# Patient Record
Sex: Female | Born: 1980 | Hispanic: No | Marital: Married | Smoking: Never smoker
Health system: Southern US, Community
[De-identification: ages and names within clinical notes are randomized; demographics above are authoritative.]

## PROBLEM LIST (undated history)

## (undated) DIAGNOSIS — D569 Thalassemia, unspecified: Secondary | ICD-10-CM

## (undated) DIAGNOSIS — K209 Esophagitis, unspecified without bleeding: Secondary | ICD-10-CM

## (undated) DIAGNOSIS — K2970 Gastritis, unspecified, without bleeding: Secondary | ICD-10-CM

## (undated) HISTORY — DX: Gastritis, unspecified, without bleeding: K29.70

## (undated) HISTORY — PX: WISDOM TOOTH EXTRACTION: SHX21

## (undated) HISTORY — DX: Esophagitis, unspecified without bleeding: K20.90

## (undated) HISTORY — DX: Esophagitis, unspecified: K20.9

## (undated) HISTORY — DX: Thalassemia, unspecified: D56.9

---

## 2005-08-06 ENCOUNTER — Emergency Department: Payer: Self-pay | Admitting: General Practice

## 2012-04-05 ENCOUNTER — Emergency Department: Payer: Self-pay | Admitting: Emergency Medicine

## 2012-11-04 ENCOUNTER — Encounter: Payer: Self-pay | Admitting: Obstetrics and Gynecology

## 2014-04-17 LAB — OB RESULTS CONSOLE ANTIBODY SCREEN: Antibody Screen: NEGATIVE

## 2014-04-17 LAB — OB RESULTS CONSOLE GC/CHLAMYDIA
CHLAMYDIA, DNA PROBE: NEGATIVE
Gonorrhea: NEGATIVE

## 2014-04-17 LAB — OB RESULTS CONSOLE ABO/RH: RH TYPE: POSITIVE

## 2014-04-17 LAB — OB RESULTS CONSOLE RUBELLA ANTIBODY, IGM: RUBELLA: IMMUNE

## 2014-04-17 LAB — OB RESULTS CONSOLE HEPATITIS B SURFACE ANTIGEN: Hepatitis B Surface Ag: NEGATIVE

## 2014-04-17 LAB — OB RESULTS CONSOLE VARICELLA ZOSTER ANTIBODY, IGG: VARICELLA IGG: IMMUNE

## 2014-09-05 LAB — OB RESULTS CONSOLE HIV ANTIBODY (ROUTINE TESTING): HIV: NONREACTIVE

## 2014-09-05 LAB — OB RESULTS CONSOLE RPR: RPR: NONREACTIVE

## 2014-11-06 LAB — OB RESULTS CONSOLE GBS: GBS: NEGATIVE

## 2014-12-07 ENCOUNTER — Inpatient Hospital Stay
Admission: EM | Admit: 2014-12-07 | Discharge: 2014-12-11 | DRG: 765 | Disposition: A | Discharge status: Home / Self Care | Payer: Medicaid Other | Attending: Obstetrics & Gynecology | Admitting: Obstetrics & Gynecology

## 2014-12-07 ENCOUNTER — Encounter: Payer: Self-pay | Admitting: Obstetrics & Gynecology

## 2014-12-07 DIAGNOSIS — E669 Obesity, unspecified: Secondary | ICD-10-CM | POA: Diagnosis present

## 2014-12-07 DIAGNOSIS — D560 Alpha thalassemia: Secondary | ICD-10-CM | POA: Diagnosis present

## 2014-12-07 DIAGNOSIS — O480 Post-term pregnancy: Principal | ICD-10-CM | POA: Diagnosis present

## 2014-12-07 DIAGNOSIS — O09523 Supervision of elderly multigravida, third trimester: Secondary | ICD-10-CM

## 2014-12-07 DIAGNOSIS — O9902 Anemia complicating childbirth: Secondary | ICD-10-CM | POA: Diagnosis present

## 2014-12-07 DIAGNOSIS — Z6830 Body mass index (BMI) 30.0-30.9, adult: Secondary | ICD-10-CM | POA: Diagnosis not present

## 2014-12-07 DIAGNOSIS — D573 Sickle-cell trait: Secondary | ICD-10-CM | POA: Diagnosis present

## 2014-12-07 DIAGNOSIS — O411230 Chorioamnionitis, third trimester, not applicable or unspecified: Secondary | ICD-10-CM | POA: Diagnosis not present

## 2014-12-07 DIAGNOSIS — Z3A41 41 weeks gestation of pregnancy: Secondary | ICD-10-CM | POA: Diagnosis present

## 2014-12-07 DIAGNOSIS — O99214 Obesity complicating childbirth: Secondary | ICD-10-CM | POA: Diagnosis present

## 2014-12-07 LAB — CBC
HCT: 37.2 % (ref 35.0–47.0)
Hemoglobin: 12 g/dL (ref 12.0–16.0)
MCH: 22.7 pg — AB (ref 26.0–34.0)
MCHC: 32.4 g/dL (ref 32.0–36.0)
MCV: 70.1 fL — ABNORMAL LOW (ref 80.0–100.0)
Platelets: 223 10*3/uL (ref 150–440)
RBC: 5.31 MIL/uL — ABNORMAL HIGH (ref 3.80–5.20)
RDW: 14.9 % — AB (ref 11.5–14.5)
WBC: 11.6 10*3/uL — ABNORMAL HIGH (ref 3.6–11.0)

## 2014-12-07 LAB — TYPE AND SCREEN
ABO/RH(D): B POS
ANTIBODY SCREEN: NEGATIVE

## 2014-12-07 MED ORDER — OXYTOCIN 40 UNITS IN LACTATED RINGERS INFUSION - SIMPLE MED
INTRAVENOUS | Status: AC
  Administered 2014-12-07: 2 m[IU]/min via INTRAVENOUS
  Filled 2014-12-07: qty 1000

## 2014-12-07 MED ORDER — OXYTOCIN 40 UNITS IN LACTATED RINGERS INFUSION - SIMPLE MED
62.5000 mL/h | INTRAVENOUS | Status: DC
  Administered 2014-12-08: 100 mL via INTRAVENOUS

## 2014-12-07 MED ORDER — TERBUTALINE SULFATE 1 MG/ML IJ SOLN
0.2500 mg | Freq: Once | INTRAMUSCULAR | Status: DC | PRN
Start: 2014-12-07 — End: 2014-12-08

## 2014-12-07 MED ORDER — MISOPROSTOL 200 MCG PO TABS
ORAL_TABLET | ORAL | Status: AC
  Filled 2014-12-07: qty 4

## 2014-12-07 MED ORDER — AMMONIA AROMATIC IN INHA
RESPIRATORY_TRACT | Status: AC
  Filled 2014-12-07: qty 10

## 2014-12-07 MED ORDER — OXYTOCIN BOLUS FROM INFUSION: 500.0000 mL | INTRAVENOUS | Status: DC

## 2014-12-07 MED ORDER — LACTATED RINGERS IV SOLN
INTRAVENOUS | Status: DC
  Administered 2014-12-07 – 2014-12-08 (×2): via INTRAVENOUS
  Administered 2014-12-08: 250 mL via INTRAVENOUS

## 2014-12-07 MED ORDER — ONDANSETRON HCL 4 MG/2ML IJ SOLN
4.0000 mg | Freq: Four times a day (QID) | INTRAMUSCULAR | Status: DC | PRN
  Administered 2014-12-08: 4 mg via INTRAVENOUS

## 2014-12-07 MED ORDER — LIDOCAINE HCL (PF) 1 % IJ SOLN
30.0000 mL | INTRAMUSCULAR | Status: DC | PRN
  Filled 2014-12-07: qty 30

## 2014-12-07 MED ORDER — ZOLPIDEM TARTRATE 5 MG PO TABS: 5.0000 mg | ORAL_TABLET | Freq: Every evening | ORAL | Status: DC | PRN

## 2014-12-07 MED ORDER — CITRIC ACID-SODIUM CITRATE 334-500 MG/5ML PO SOLN
30.0000 mL | ORAL | Status: DC | PRN
  Administered 2014-12-08: 30 mL via ORAL

## 2014-12-07 MED ORDER — OXYTOCIN 40 UNITS IN LACTATED RINGERS INFUSION - SIMPLE MED
1.0000 m[IU]/min | INTRAVENOUS | Status: DC
  Administered 2014-12-07: 2 m[IU]/min via INTRAVENOUS
  Filled 2014-12-07: qty 1000

## 2014-12-07 MED ORDER — OXYTOCIN 10 UNIT/ML IJ SOLN
INTRAMUSCULAR | Status: AC
  Filled 2014-12-07: qty 2

## 2014-12-07 MED ORDER — LIDOCAINE HCL (PF) 1 % IJ SOLN
INTRAMUSCULAR | Status: AC
  Filled 2014-12-07: qty 30

## 2014-12-07 MED ORDER — LACTATED RINGERS IV SOLN
500.0000 mL | INTRAVENOUS | Status: DC | PRN
Start: 2014-12-07 — End: 2014-12-08

## 2014-12-07 NOTE — Plan of Care (Signed)
Problem: Consults Goal: Birthing Suites Patient Information Press F2 to bring up selections list  Pt > [redacted] weeks EGA and Inpatient induction        

## 2014-12-07 NOTE — Progress Notes (Signed)
Pt arrived to [facility] for IOL per Dr. Elesa Massed order.  [facility] 11/30/2014. [id number].  Ellison Carwin RNC

## 2014-12-07 NOTE — Plan of Care (Signed)
Problem: Consults Goal: Birthing Suites Patient Information Press F2 to bring up selections list Outcome: Completed/Met Date Met:  12/07/14  Pt > [redacted] weeks EGA and Inpatient induction

## 2014-12-07 NOTE — H&P (Signed)
OB History & Physical   History of Present Illness:  Chief Complaint:   HPI:  Emily Bailey is a 34 y.o. G2P0010 at 94.0 dated by LMP with early 1st trimester [place] and EDC of 11/30/14. She presents to L&D for evaluation of IOL for past due dates.    Pregnancy issues: Hgb E trait  Alpha thalassemia FOB with sickle cell trait AMA Obesity (BMI 30)   +FM, no CTX, no LOF, no VB  Maternal Medical History:  No past medical history on file.  No past surgical history on file.  Allergies not on file  Prior to Admission medications   Not on File     Prenatal care site: [facility]   Social History: neg     Family History: family history is not on file.   Review of Systems: Negative x 10 systems reviewed except as noted in the HPI.     Physical Exam:  Vital Signs: Temp(Src) 97.8 F (36.6 C) (Oral)  Resp 18  Ht  (1.473 m)  Wt 78.472 kg (173 lb)  BMI 36.17 kg/m2 General: no acute distress.  HEENT: normocephalic, atraumatic Heart: regular rate & rhythm.  No murmurs/rubs/gallops Lungs: clear to auscultation bilaterally, normal respiratory effort Abdomen: soft, gravid, non-tender;  EFW: 8.2oz Pelvic:   External: Normal external female genitalia  Cervix: Dilation: 4/80/-1   Extremities: non-tender, symmetric, 1+ edema bilaterally.  DTRs: 2+  Neurologic: Alert & oriented x 3.    Pertinent Results:  Prenatal Labs: Blood type/Rh B+  Antibody screen neg  Rubella Varicella Immune Immune  RPR NR  HBsAg neg  HIV NR  GC neg  Chlamydia neg  Genetic screening declined  1 hour GTT 132  3 hour GTT n/a  GBS neg   FHT: 150 mod + accesl no decels TOCO: q104min SVE: 4/80/-1    Assessment:  Emily Bailey is a 34 y.o. G2P0010 @ 41.0 with IOL for past due date.   Plan:  1. Admit to Labor & Delivery  2. CBC, T&S, Clrs, IVF 3. GBS neg 4. Consents obtained. 5. Continuous toco/efm 6. AROM when nursing available, augment with pitocin if labor does not progress  after AROM 7. Expect vaginal delivery  Emily Bailey, Elenora Fender  12/07/2014 3:13 PM

## 2014-12-07 NOTE — H&P (Signed)
Intrapartum progress note:  S patient comfortable no complaints,feels some back pain/pelvic pressure O: BP 119/73 mmHg  Pulse 91  Temp(Src) 97.8 F (36.6 C) (Oral)  Resp 18  SVE: 4/80/-1 FHT: 140 mod +accels no decels Toco: q  A/P: 34yo G2P0010 @ 41.0 with IOL for past due date 1. Continue IOL - add pitocin for contractions 2. Category 1 strip 3. Continue efm/toco  ----- Ranae Plumber, MD Attending Obstetrician and Gynecologist [facility]

## 2014-12-08 ENCOUNTER — Inpatient Hospital Stay: Payer: Medicaid Other | Admitting: Anesthesiology

## 2014-12-08 ENCOUNTER — Encounter
Admission: EM | Disposition: A | Discharge status: Home / Self Care | Payer: Self-pay | Source: Home / Self Care | Attending: Obstetrics & Gynecology

## 2014-12-08 ENCOUNTER — Encounter: Payer: Self-pay | Admitting: *Deleted

## 2014-12-08 LAB — ABO/RH: ABO/RH(D): B POS

## 2014-12-08 LAB — RPR: RPR Ser Ql: NONREACTIVE

## 2014-12-08 SURGERY — Surgical Case
Anesthesia: Epidural | Wound class: Clean Contaminated

## 2014-12-08 MED ORDER — PHENYLEPHRINE 40 MCG/ML (10ML) SYRINGE FOR IV PUSH (FOR BLOOD PRESSURE SUPPORT)
80.0000 ug | PREFILLED_SYRINGE | INTRAVENOUS | Status: DC | PRN
Start: 2014-12-08 — End: 2014-12-08
  Filled 2014-12-08: qty 2

## 2014-12-08 MED ORDER — BUPIVACAINE HCL 0.5 % IJ SOLN
INTRAMUSCULAR | Status: DC | PRN
  Administered 2014-12-08: 10 mL

## 2014-12-08 MED ORDER — EPINEPHRINE HCL 0.1 MG/ML IJ SOSY
PREFILLED_SYRINGE | INTRAMUSCULAR | Status: DC | PRN
  Administered 2014-12-08: 0.1 mg via INTRAVENOUS

## 2014-12-08 MED ORDER — PRENATAL MULTIVITAMIN CH
1.0000 | ORAL_TABLET | Freq: Every day | ORAL | Status: DC
  Administered 2014-12-09 – 2014-12-10 (×2): 1 via ORAL
  Filled 2014-12-08 (×2): qty 1

## 2014-12-08 MED ORDER — BUPIVACAINE ON-Q PAIN PUMP (FOR ORDER SET NO CHG): INJECTION | Status: DC

## 2014-12-08 MED ORDER — BUPIVACAINE 0.25 % ON-Q PUMP DUAL CATH 400 ML: 400.0000 mL | INJECTION | Status: DC

## 2014-12-08 MED ORDER — DIPHENHYDRAMINE HCL 25 MG PO CAPS: 25.0000 mg | ORAL_CAPSULE | Freq: Four times a day (QID) | ORAL | Status: DC | PRN

## 2014-12-08 MED ORDER — BUTORPHANOL TARTRATE 1 MG/ML IJ SOLN
2.0000 mg | INTRAMUSCULAR | Status: DC | PRN
  Administered 2014-12-08 (×2): 2 mg via INTRAVENOUS

## 2014-12-08 MED ORDER — SODIUM CHLORIDE 0.9 % IV SOLN
2.0000 g | Freq: Four times a day (QID) | INTRAVENOUS | Status: AC
  Administered 2014-12-08 – 2014-12-09 (×3): 2 g via INTRAVENOUS
  Filled 2014-12-08 (×5): qty 2000

## 2014-12-08 MED ORDER — LACTATED RINGERS IV SOLN: INTRAVENOUS | Status: DC

## 2014-12-08 MED ORDER — CEFAZOLIN SODIUM-DEXTROSE 2-3 GM-% IV SOLR: 2.0000 g | INTRAVENOUS | Status: DC

## 2014-12-08 MED ORDER — FERROUS SULFATE 325 (65 FE) MG PO TABS
325.0000 mg | ORAL_TABLET | Freq: Two times a day (BID) | ORAL | Status: DC
  Administered 2014-12-09 – 2014-12-11 (×4): 325 mg via ORAL
  Filled 2014-12-08 (×4): qty 1

## 2014-12-08 MED ORDER — ONDANSETRON HCL 4 MG/2ML IJ SOLN: 4.0000 mg | Freq: Once | INTRAMUSCULAR | Status: DC | PRN

## 2014-12-08 MED ORDER — CITRIC ACID-SODIUM CITRATE 334-500 MG/5ML PO SOLN: 30.0000 mL | ORAL | Status: DC

## 2014-12-08 MED ORDER — FENTANYL 2.5 MCG/ML W/ROPIVACAINE 0.2% IN NS 100 ML EPIDURAL INFUSION (ARMC-ANES): 10.0000 mL/h | EPIDURAL | Status: DC

## 2014-12-08 MED ORDER — PHENYLEPHRINE HCL 10 MG/ML IJ SOLN
INTRAMUSCULAR | Status: DC | PRN
  Administered 2014-12-08 (×7): 100 ug via INTRAVENOUS

## 2014-12-08 MED ORDER — LIDOCAINE HCL (PF) 2 % IJ SOLN
INTRAMUSCULAR | Status: DC | PRN
  Administered 2014-12-08 (×2): 100 mg via EPIDURAL

## 2014-12-08 MED ORDER — SODIUM CHLORIDE 0.9 % IV SOLN
INTRAVENOUS | Status: AC
  Administered 2014-12-08: 2 g via INTRAVENOUS
  Filled 2014-12-08: qty 2000

## 2014-12-08 MED ORDER — BUPIVACAINE HCL 0.5 % IJ SOLN: 5.0000 mL | Freq: Once | INTRAMUSCULAR | Status: DC

## 2014-12-08 MED ORDER — BUTORPHANOL TARTRATE 1 MG/ML IJ SOLN
INTRAMUSCULAR | Status: AC
  Administered 2014-12-08: 2 mg via INTRAVENOUS
  Filled 2014-12-08: qty 2

## 2014-12-08 MED ORDER — CITRIC ACID-SODIUM CITRATE 334-500 MG/5ML PO SOLN
ORAL | Status: AC
  Administered 2014-12-08: 30 mL via ORAL
  Filled 2014-12-08: qty 15

## 2014-12-08 MED ORDER — GENTAMICIN SULFATE 40 MG/ML IJ SOLN
100.0000 mg | Freq: Three times a day (TID) | INTRAMUSCULAR | Status: DC
  Filled 2014-12-08 (×3): qty 2.5

## 2014-12-08 MED ORDER — DIBUCAINE 1 % RE OINT: 1.0000 "application " | TOPICAL_OINTMENT | RECTAL | Status: DC | PRN

## 2014-12-08 MED ORDER — MENTHOL 3 MG MT LOZG: 1.0000 | LOZENGE | OROMUCOSAL | Status: DC | PRN

## 2014-12-08 MED ORDER — EPHEDRINE 5 MG/ML INJ
10.0000 mg | INTRAVENOUS | Status: DC | PRN
  Filled 2014-12-08: qty 2

## 2014-12-08 MED ORDER — CEFAZOLIN SODIUM-DEXTROSE 2-3 GM-% IV SOLR
INTRAVENOUS | Status: AC
  Filled 2014-12-08: qty 50

## 2014-12-08 MED ORDER — ACETAMINOPHEN 10 MG/ML IV SOLN
1000.0000 mg | Freq: Once | INTRAVENOUS | Status: AC
  Administered 2014-12-08: 1000 mg via INTRAVENOUS
  Filled 2014-12-08 (×2): qty 100

## 2014-12-08 MED ORDER — KETOROLAC TROMETHAMINE 30 MG/ML IJ SOLN
30.0000 mg | Freq: Four times a day (QID) | INTRAMUSCULAR | Status: AC
  Administered 2014-12-08 – 2014-12-09 (×2): 30 mg via INTRAVENOUS
  Filled 2014-12-08 (×2): qty 1

## 2014-12-08 MED ORDER — MORPHINE SULFATE (PF) 0.5 MG/ML IJ SOLN
INTRAMUSCULAR | Status: DC | PRN
  Administered 2014-12-08: .5 mg via EPIDURAL

## 2014-12-08 MED ORDER — HYDROCODONE-ACETAMINOPHEN 5-325 MG PO TABS
1.0000 | ORAL_TABLET | ORAL | Status: DC | PRN
  Administered 2014-12-09 – 2014-12-10 (×6): 2 via ORAL
  Administered 2014-12-10 – 2014-12-11 (×3): 1 via ORAL
  Administered 2014-12-11: 2 via ORAL
  Filled 2014-12-08 (×4): qty 2
  Filled 2014-12-08: qty 1
  Filled 2014-12-08 (×4): qty 2
  Filled 2014-12-08: qty 1

## 2014-12-08 MED ORDER — METHYLERGONOVINE MALEATE 0.2 MG/ML IJ SOLN
INTRAMUSCULAR | Status: AC
  Filled 2014-12-08: qty 1

## 2014-12-08 MED ORDER — SIMETHICONE 80 MG PO CHEW
80.0000 mg | CHEWABLE_TABLET | Freq: Three times a day (TID) | ORAL | Status: DC
  Administered 2014-12-09 – 2014-12-11 (×6): 80 mg via ORAL
  Filled 2014-12-08 (×6): qty 1

## 2014-12-08 MED ORDER — GENTAMICIN SULFATE 40 MG/ML IJ SOLN
120.0000 mg | Freq: Once | INTRAVENOUS | Status: AC
  Administered 2014-12-08: 120 mg via INTRAVENOUS
  Filled 2014-12-08: qty 3

## 2014-12-08 MED ORDER — BUPIVACAINE HCL (PF) 0.5 % IJ SOLN
INTRAMUSCULAR | Status: AC
  Filled 2014-12-08: qty 30

## 2014-12-08 MED ORDER — DIPHENHYDRAMINE HCL 50 MG/ML IJ SOLN: 12.5000 mg | INTRAMUSCULAR | Status: DC | PRN

## 2014-12-08 MED ORDER — SENNOSIDES-DOCUSATE SODIUM 8.6-50 MG PO TABS
2.0000 | ORAL_TABLET | ORAL | Status: DC
  Administered 2014-12-09 – 2014-12-10 (×4): 2 via ORAL
  Filled 2014-12-08 (×4): qty 2

## 2014-12-08 MED ORDER — WITCH HAZEL-GLYCERIN EX PADS: 1.0000 "application " | MEDICATED_PAD | CUTANEOUS | Status: DC | PRN

## 2014-12-08 MED ORDER — GENTAMICIN IN SALINE 1-0.9 MG/ML-% IV SOLN
100.0000 mg | Freq: Three times a day (TID) | INTRAVENOUS | Status: DC
  Filled 2014-12-08 (×2): qty 100

## 2014-12-08 MED ORDER — CLINDAMYCIN PHOSPHATE 900 MG/50ML IV SOLN
900.0000 mg | Freq: Three times a day (TID) | INTRAVENOUS | Status: AC
  Administered 2014-12-08 – 2014-12-09 (×3): 900 mg via INTRAVENOUS
  Filled 2014-12-08 (×3): qty 50

## 2014-12-08 MED ORDER — SODIUM CHLORIDE 0.9 % IV SOLN
2.0000 g | Freq: Four times a day (QID) | INTRAVENOUS | Status: DC
  Administered 2014-12-08: 2 g via INTRAVENOUS
  Filled 2014-12-08 (×3): qty 2000

## 2014-12-08 MED ORDER — FENTANYL 2.5 MCG/ML W/ROPIVACAINE 0.2% IN NS 100 ML EPIDURAL INFUSION (ARMC-ANES)
EPIDURAL | Status: AC
  Administered 2014-12-08: 10 mL/h via EPIDURAL
  Filled 2014-12-08: qty 100

## 2014-12-08 MED ORDER — LANOLIN HYDROUS EX OINT: 1.0000 "application " | TOPICAL_OINTMENT | CUTANEOUS | Status: DC | PRN

## 2014-12-08 MED ORDER — OXYTOCIN 40 UNITS IN LACTATED RINGERS INFUSION - SIMPLE MED
62.5000 mL/h | INTRAVENOUS | Status: AC
  Administered 2014-12-09: 62.5 mL/h via INTRAVENOUS
  Filled 2014-12-08: qty 1000

## 2014-12-08 MED ORDER — FENTANYL CITRATE (PF) 100 MCG/2ML IJ SOLN: 25.0000 ug | INTRAMUSCULAR | Status: DC | PRN

## 2014-12-08 SURGICAL SUPPLY — 27 items
CANISTER SUCT 3000ML (MISCELLANEOUS) ×3 IMPLANT
CATH KIT ON-Q SILVERSOAK 5IN (CATHETERS) ×6 IMPLANT
CLOSURE WOUND 1/2 X4 (GAUZE/BANDAGES/DRESSINGS) ×2
DRSG TELFA 3X8 NADH (GAUZE/BANDAGES/DRESSINGS) ×3 IMPLANT
ELECT CAUTERY BLADE 6.4 (BLADE) ×3 IMPLANT
GAUZE SPONGE 4X4 12PLY STRL (GAUZE/BANDAGES/DRESSINGS) ×3 IMPLANT
GLOVE BIO SURGEON STRL SZ7 (GLOVE) ×9 IMPLANT
GLOVE INDICATOR 7.5 STRL GRN (GLOVE) ×9 IMPLANT
GOWN STRL REUS W/ TWL LRG LVL3 (GOWN DISPOSABLE) ×3 IMPLANT
GOWN STRL REUS W/TWL LRG LVL3 (GOWN DISPOSABLE) ×6
LIQUID BAND (GAUZE/BANDAGES/DRESSINGS) IMPLANT
NS IRRIG 1000ML POUR BTL (IV SOLUTION) ×3 IMPLANT
PACK C SECTION AR (MISCELLANEOUS) ×3 IMPLANT
PAD GROUND ADULT SPLIT (MISCELLANEOUS) ×3 IMPLANT
PAD OB MATERNITY 4.3X12.25 (PERSONAL CARE ITEMS) ×6 IMPLANT
PAD PREP 24X41 OB/GYN DISP (PERSONAL CARE ITEMS) ×3 IMPLANT
SPONGE LAP 18X18 5 PK (GAUZE/BANDAGES/DRESSINGS) ×3 IMPLANT
STRIP CLOSURE SKIN 1/2X4 (GAUZE/BANDAGES/DRESSINGS) ×4 IMPLANT
SUT CHROMIC GUT BROWN 0 54 (SUTURE) ×1 IMPLANT
SUT CHROMIC GUT BROWN 0 54IN (SUTURE) ×3
SUT MNCRL 4-0 (SUTURE) ×2
SUT MNCRL 4-0 27XMFL (SUTURE) ×1
SUT PDS AB 1 TP1 96 (SUTURE) ×3 IMPLANT
SUT PLAIN 2 0 XLH (SUTURE) ×3 IMPLANT
SUT VIC AB 0 CT1 36 (SUTURE) ×12 IMPLANT
SUTURE MNCRL 4-0 27XMF (SUTURE) ×1 IMPLANT
SWABSTK COMLB BENZOIN TINCTURE (MISCELLANEOUS) ×3 IMPLANT

## 2014-12-08 NOTE — Anesthesia Preprocedure Evaluation (Signed)
Anesthesia Evaluation  Patient identified by MRN, date of birth, ID band Patient awake    Reviewed: Allergy & Precautions, NPO status , Patient's Chart, lab work & pertinent test results  History of Anesthesia Complications (+) history of anesthetic complications  Airway Mallampati: II  TM Distance: <3 FB Neck ROM: Full    Dental  (+) Teeth Intact   Pulmonary          Cardiovascular negative cardio ROS      Neuro/Psych negative neurological ROS     GI/Hepatic negative GI ROS, Neg liver ROS,   Endo/Other    Renal/GU negative Renal ROS     Musculoskeletal   Abdominal   Peds  Hematology negative hematology ROS (+)   Anesthesia Other Findings   Reproductive/Obstetrics (+) Pregnancy                             Anesthesia Physical Anesthesia Plan  ASA: II  Anesthesia Plan: Epidural   Post-op Pain Management:    Induction:   Airway Management Planned:   Additional Equipment:   Intra-op Plan:   Post-operative Plan:   Informed Consent: I have reviewed the patients History and Physical, chart, labs and discussed the procedure including the risks, benefits and alternatives for the proposed anesthesia with the patient or authorized representative who has indicated his/her understanding and acceptance.     Plan Discussed with:   Anesthesia Plan Comments:         Anesthesia Quick Evaluation

## 2014-12-08 NOTE — Anesthesia Preprocedure Evaluation (Addendum)
Anesthesia Evaluation  Patient identified by MRN, date of birth, ID band Patient awake    Reviewed: Allergy & Precautions, NPO status , Patient's Chart, lab work & pertinent test results, reviewed documented beta blocker date and time   Airway Mallampati: III  TM Distance: >3 FB     Dental  (+) Chipped   Pulmonary          Cardiovascular     Neuro/Psych    GI/Hepatic   Endo/Other    Renal/GU      Musculoskeletal   Abdominal   Peds  Hematology   Anesthesia Other Findings Epidural working well. Will dose 1.5% lidocaine and epi and astromorph thru catheter.  Reproductive/Obstetrics                            Anesthesia Physical Anesthesia Plan  ASA: II  Anesthesia Plan: Epidural   Post-op Pain Management:    Induction:   Airway Management Planned:   Additional Equipment:   Intra-op Plan:   Post-operative Plan:   Informed Consent: I have reviewed the patients History and Physical, chart, labs and discussed the procedure including the risks, benefits and alternatives for the proposed anesthesia with the patient or authorized representative who has indicated his/her understanding and acceptance.     Plan Discussed with: CRNA  Anesthesia Plan Comments:         Anesthesia Quick Evaluation

## 2014-12-08 NOTE — Progress Notes (Addendum)
Patient now febrile with temp >102, P>110, fetal tachycardia. This meets diagnostic criteria for chorioamnionitis. Will start ampicillin 2 grams q 6 hours and gentamicin  initial dose, then  q 8hours. Will watch fetal status closely and continue to expedite delivery FWB: category 2 overall.  Will try to give Tylenol to mom and hope for defervescence to improve fetal status.

## 2014-12-08 NOTE — Progress Notes (Signed)
Patient still febrile after tylenol, ampicillin, gentimicin.  Pitocin has been off for over one hour and she is still having mild spontaneous contractions, with each the fetus is having late decelerations.  She has had at least two IVF boluses. Discussed need to expedite delivery. However, I am unable to start pitocin on her without her have recurrent late decelerations. Therefore, I recommend a primary cesarean delivery due to fetal intolerance of labor, likely due to a combination of factors. However, given that there were reported late decelerations prior to developing chorio, the primary reason is likely uteroplacental insufficiency.  The risks and benefits of proceeding with attempted vaginal delivery versus moving forward with cesarean delivery (and its attendant risks and benefits) were explained in detail.  She voiced understanding and desire to move forward with cesarean delivery.

## 2014-12-08 NOTE — Anesthesia Procedure Notes (Signed)
Epidural Patient location during procedure: OB Start time: 12/08/2014 5:00 AM End time: 12/08/2014 5:23 AM  Staffing Performed by: anesthesiologist   Preanesthetic Checklist Completed: patient identified, site marked, surgical consent, pre-op evaluation, timeout performed, IV checked, risks and benefits discussed and monitors and equipment checked  Epidural Patient position: sitting Prep: Betadine Patient monitoring: heart rate, continuous pulse ox and blood pressure Approach: midline Location: L4-L5 Injection technique: LOR saline  Needle:  Needle type: Tuohy  Needle gauge: 18 G Needle length: 9 cm and 9 Needle insertion depth: 7 cm Catheter type: closed end flexible Catheter size: 20 Guage Catheter at skin depth: 8 cm Test dose: negative and 1.5% lidocaine with Epi 1:200 K  Assessment Sensory level: T10 Events: blood not aspirated, injection not painful, no injection resistance, negative IV test and no paresthesia  Additional Notes   Patient tolerated the insertion well without complications.Reason for block:procedure for pain

## 2014-12-08 NOTE — Progress Notes (Signed)
L&D Note  12/08/2014 - 9:16 AM  Subjective:  Comfortable with epidural  Objective:   Filed Vitals:   12/08/14 0629 12/08/14 0634 12/08/14 0639 12/08/14 0805  BP:    104/55  Pulse:    98  Temp:      TempSrc:      Resp:      Height:      Weight:      SpO2: 100% 100% 99%     Current Vital Signs 24h Vital Sign Ranges  T 98.4 F (36.9 C) Temp  Avg: 98.2 F (36.8 C)  Min: 97.8 F (36.6 C)  Max: 98.5 F (36.9 C)  BP (!) 104/55 mmHg BP  Min: 88/52  Max: 122/76  HR 98 Pulse  Avg: 99.1  Min: 86  Max: 116  RR 20 Resp  Avg: 18.4  Min: 18  Max: 20  SaO2 99 %   SpO2  Avg: 99.4 %  Min: 98 %  Max: 100 %       24 Hour I/O Current Shift I/O  Time Ins Outs        FHR: 145/mod var/+accels/periodic variable decelerations, report of infrequent late decelerations =>improved after iv fluid bolus Toco: 3 q 10 min Gen: NAD, comfortable SVE: 5/80/-1 (Previously reported as 9-9.5cm per RN on prior shift)  Labs:   Recent Labs Lab 12/07/14 1804  WBC 11.6*  HGB 12.0  HCT 37.2  PLT 223   Medications SCHEDULED MEDICATIONS   None  MEDICATION INFUSIONS  . lactated ringers 250 mL (12/08/14 0720)  . oxytocin 40 units in LR 1000 mL    . oxytocin 40 units in LR 1000 mL 10 milli-units/min (12/08/14 0817)  . oxytocin 40 units in LR 1000 mL      PRN MEDICATIONS  butorphanol, citric acid-sodium citrate, lactated ringers, lidocaine (PF), ondansetron, terbutaline, zolpidem   Assessment & Plan:  34 y.o. G2P0010 at [redacted]w[redacted]d IOL  * Labor: Continue pitocin per protocol.  Will place IUPC if no change once reaches 20. * FWB: Reassuring overall. Will monitor.  ROM > 18 hours now.   *GBS: neg *Analgesia: epidural  Conard Novak, MD 12/08/2014 9:16 AM  [facility]

## 2014-12-08 NOTE — Transfer of Care (Signed)
Immediate Anesthesia Transfer of Care Note  Patient: Emily Bailey  Procedure(s) Performed: Procedure(s): PRIMARY CESAREAN SECTION (N/A)  Patient Location: PACU and Mother/Baby  Anesthesia Type:Epidural  Level of Consciousness: awake, alert  and oriented  Airway & Oxygen Therapy: Patient Spontanous Breathing  Post-op Assessment: Report given to RN  Post vital signs: Reviewed and stable  Last Vitals:  Filed Vitals:   12/08/14 1618  BP: 117/59  Pulse: 103  Temp: 97.51F  Resp: 20    Complications: No apparent anesthesia complications

## 2014-12-08 NOTE — Discharge Summary (Signed)
Obstetrical Discharge Summary  Date of Admission: 12/07/2014 Date of Discharge: 12/11/2014  Primary OB: [facility]  Gestational Age at Delivery: [redacted]w[redacted]d   Antepartum complications: Advanced maternal age, hemoglobin E trait Reason for Admission: induction of labor for dates. Date of Delivery:  12/08/2014  Delivered By: Conard Novak, MD Delivery Type: primary cesarean section, low transverse incision Intrapartum complications/course: Patient developed chorioamnionitis, given IV ampicillin and gentamicin and tylenol.  Prior to being diagnosed with chorioamnionitis, the fetus was having late decelerations and not tolerating contractions.  Due to fetal intolerance of labor, the patient was taken to the OR.   Anesthesia: epidural Placenta: manual Laceration: None Episiotomy: none Newborn Data: Live born female  Birth Weight: 7 lb 13.6 oz (3561 g) APGAR: 8, 9  Discharge Physical Exam:  BP 108/60 mmHg  Pulse 80  Temp(Src) 98 F (36.7 C) (Oral)  Resp 20  Ht  (1.473 m)  Wt 78.472 kg (173 lb)  BMI 36.17 kg/m2  SpO2 98%  Breastfeeding? Unknown  General: NAD CV: RRR Pulm: CTABL, nl effort ABD: s/nd/nt, fundus firm and below the umbilicus Lochia: moderate Incision: c/d/i, covered in steris; on-q pump in place DVT Evaluation: LE non-ttp, no evidence of DVT on exam.  HEMOGLOBIN  Date Value Ref Range Status  12/10/2014 10.0* 12.0 - 16.0 g/dL Final   HCT  Date Value Ref Range Status  12/10/2014 30.1* 35.0 - 47.0 % Final    Post partum course: routine, no events noted Postpartum Procedures: none Disposition: stable, discharge to home.  Rh Immune globulin given: n/a Rubella vaccine given: n/a Tdap vaccine given in AP or PP setting: given 10/23/14 Flu vaccine given in AP or PP setting: no  Contraception: TBD at post partum visit  Prenatal Labs:  B+/RI/VZi   Plan:  Emily Bailey was discharged to home in good condition. Follow-up appointment at [facility]  with Dr Jean Rosenthal in 1week   Discharge Medications:   Medication List    TAKE these medications        HYDROcodone-acetaminophen 5-325 MG per tablet  Commonly known as:  NORCO/VICODIN  Take 1-2 tablets by mouth every 4 (four) hours as needed for moderate pain.     ibuprofen 600 MG tablet  Commonly known as:  ADVIL,MOTRIN  Take 1 tablet (600 mg total) by mouth every 6 (six) hours as needed.     multivitamin-prenatal 27-0.8 MG Tabs tablet  Take 1 tablet by mouth daily at 12 noon.        Signed: ----- Ranae Plumber, MD Attending Obstetrician and Gynecologist [facility]

## 2014-12-08 NOTE — Op Note (Signed)
Emily Bailey   Emily Bailey   12/08/2014   Pre-operative Diagnosis: 1) intrauterine pregnancy at [redacted]w[redacted]d, 2) fetal intolerance of labor, 3) Chorioamnionitis   Post-operative Diagnosis: 1) intrauterine pregnancy at [redacted]w[redacted]d, 2) fetal intolerance of labor, 3) Chorioamnionitis  Procedure: Primary low transverse Emily section via Pfannenstiel incision  Surgeon: Surgeon(s) and Role:    * Conard Novak, MD - Primary   Assistants: Farrel Conners, CNM  Anesthesia: epidural   Findings:  1) normal appearing gravid uterus, fallopian tubes, and ovaries 2) Viable female infant "Emily Bailey" with APGARS of 8 at 1 minute and 9 at 5 minutes   Estimated Blood Loss: 750 mL  Total IV Fluids: 900 ml   Urine Output: 750 mL clear urine at end of case  Specimens: Placenta for permanent  Complications: no complications  Disposition: PACU - hemodynamically stable.   Maternal Condition: stable   Baby condition / location:  Couplet care / Skin to Skin  Procedure Details:  The patient was seen in the Holding Room. The risks, benefits, complications, treatment options, and expected outcomes were discussed with the patient. The patient concurred with the proposed plan, giving informed consent. identified as Emily Bailey and the procedure verified as C-Section Delivery. A Time Out was held and the above information confirmed.   After induction of anesthesia, the patient was draped and prepped in the usual sterile manner. A Pfannenstiel incision was made and carried down through the subcutaneous tissue to the fascia. Fascial incision was made and extended transversely. The fascia was separated from the underlying rectus tissue superiorly and inferiorly. The peritoneum was identified and entered. Peritoneal incision was extended longitudinally. The bladder flap was bluntly freed from the lower uterine segment. A low transverse uterine incision was made and the hysterotomy was  extended with cranial-caudal tension. Delivered from cephalic presentation was a 3,560 gram Living newborn infant(s) with Apgar scores of 8 at one minute and 9 at five minutes. Cord ph was not sent the umbilical cord was clamped and cut cord blood was not obtained for evaluation. The placenta was removed Intact and appeared normal. The uterine outline, tubes and ovaries appeared normal. The uterine incision was closed with running locked sutures of 0 Vicryl.  A second layer of the same suture was thrown in an imbricating fashion.  Hemostasis was assured.  The uterus was retained to the abdomen and the paracolic gutters were cleared of all clots and debris.  The peritoneum was reapproximated with 0 vicryl. The rectus muscles were inspected and found to be hemostatic.   The On-Q catheter pumps were inserted in accordance with the manufacturer's recommendations.  The catheters were inserted approximately 4cm cephelad to the incision line, approximately 1cm apart, straddling the midline.  They were inserted to a depth of the 4th mark. They were positioned superficial to the rectus abdominus muscles and deep to the rectus fascia.    The fascia was then reapproximated with running sutures of 1-0 PDS, looped. The subcutaneous tissue was reapproximated with three interrupted 3-0 Vicryl sutures to reduce skin tension. The subcuticular closure was performed using 4-0 monocryl. The skin closure was reinforced using benzoin and 1/2" steri-strips.  The On-Q catheters were bolused with 5 mL of 0.5% marcaine plain for a total of 10 mL.  The catheters were affixed to the skin with surgical skin glue, steri-strips, and tegaderm.    Instrument, sponge, and needle counts were correct prior the abdominal closure and were correct at the conclusion  of the case.  The patient received Ancef 2 gram IV prior to skin incision (within 30 minutes). For VTE prophylaxis she was wearing SCDs throughout the case.   Signed: Conard Novak, MD, FACOG 12/08/2014 4:05 PM

## 2014-12-09 LAB — CBC
HEMATOCRIT: 34.3 % — AB (ref 35.0–47.0)
HEMOGLOBIN: 11.2 g/dL — AB (ref 12.0–16.0)
MCH: 23 pg — AB (ref 26.0–34.0)
MCHC: 32.5 g/dL (ref 32.0–36.0)
MCV: 70.8 fL — ABNORMAL LOW (ref 80.0–100.0)
Platelets: 206 10*3/uL (ref 150–440)
RBC: 4.84 MIL/uL (ref 3.80–5.20)
RDW: 15.2 % — ABNORMAL HIGH (ref 11.5–14.5)
WBC: 23.5 10*3/uL — ABNORMAL HIGH (ref 3.6–11.0)

## 2014-12-09 MED ORDER — FENTANYL CITRATE (PF) 100 MCG/2ML IJ SOLN: 25.0000 ug | INTRAMUSCULAR | Status: DC | PRN

## 2014-12-09 MED ORDER — ONDANSETRON HCL 4 MG/2ML IJ SOLN: 4.0000 mg | Freq: Once | INTRAMUSCULAR | Status: DC | PRN

## 2014-12-09 NOTE — Progress Notes (Signed)
Admit Date: 12/07/2014 Today's Date: 12/09/2014  Subjective: Postpartum Day 1: Cesarean Delivery Patient reports incisional pain, tolerating PO, + flatus and no problems voiding.    Objective: Vital signs in last 24 hours: Temp:  [97.6 F (36.4 C)-101.9 F (38.8 C)] 98 F (36.7 C) (09/03 0809) Pulse Rate:  [86-117] 101 (09/03 0809) Resp:  [16-18] 18 (09/03 0809) BP: (91-123)/(41-68) 99/51 mmHg (09/03 0809) SpO2:  [97 %-100 %] 97 % (09/03 0809)  Physical Exam:  General: alert and cooperative Lochia: appropriate Uterine Fundus: firm Incision: healing well DVT Evaluation: No evidence of DVT seen on physical exam.   Recent Labs  12/07/14 1804 12/09/14 0512  HGB 12.0 11.2*  HCT 37.2 34.3*    Assessment/Plan: Status post Cesarean section. Doing well postoperatively.  Continue current care.  Francesca Strome PAUL 12/09/2014, 12:34 PM

## 2014-12-09 NOTE — Anesthesia Postprocedure Evaluation (Signed)
  Anesthesia Post-op Note  Patient: Emily Bailey  Procedure(s) Performed: Procedure(s): PRIMARY CESAREAN SECTION (N/A)  Anesthesia type:Epidural  Patient location: PACU  Post pain: Pain level controlled  Post assessment: Post-op Vital signs reviewed, Patient's Cardiovascular Status Stable, Respiratory Function Stable, Patent Airway and No signs of Nausea or vomiting  Post vital signs: Reviewed and stable  Last Vitals:  Filed Vitals:   12/09/14 0809  BP: 99/51  Pulse: 101  Temp: 36.7 C  Resp: 18    Level of consciousness: awake, alert  and patient cooperative  Complications: No apparent anesthesia complications

## 2014-12-09 NOTE — Anesthesia Postprocedure Evaluation (Signed)
  Anesthesia Post-op Note  Patient: Emily Bailey  Procedure(s) Performed: * No procedures listed *  Anesthesia type:Epidural  Patient location: PACU  Post pain: Pain level controlled  Post assessment: Post-op Vital signs reviewed, Patient's Cardiovascular Status Stable, Respiratory Function Stable, Patent Airway and No signs of Nausea or vomiting  Post vital signs: Reviewed and stable  Last Vitals:  Filed Vitals:   12/09/14 0809  BP: 99/51  Pulse: 101  Temp: 36.7 C  Resp: 18    Level of consciousness: awake, alert  and patient cooperative  Complications: No apparent anesthesia complications

## 2014-12-09 NOTE — Anesthesia Post-op Follow-up Note (Signed)
  Anesthesia Pain Follow-up Note  Patient: Emily Bailey  Day #: 1  Date of Follow-up: 12/09/2014 Time: 11:15 AM  Last Vitals:  Filed Vitals:   12/09/14 0809  BP: 99/51  Pulse: 101  Temp: 36.7 C  Resp: 18    Level of Consciousness: alert  Pain: mild   Side Effects:None  Catheter Site Exam:clean, dry, no drainage  Plan: D/C from anesthesia care  Lenard Simmer

## 2014-12-10 LAB — CBC WITH DIFFERENTIAL/PLATELET
BASOS ABS: 0 10*3/uL (ref 0–0.1)
Basophils Relative: 0 %
EOS PCT: 1 %
Eosinophils Absolute: 0.1 10*3/uL (ref 0–0.7)
HCT: 30.1 % — ABNORMAL LOW (ref 35.0–47.0)
Hemoglobin: 10 g/dL — ABNORMAL LOW (ref 12.0–16.0)
LYMPHS PCT: 11 %
Lymphs Abs: 1.9 10*3/uL (ref 1.0–3.6)
MCH: 23.1 pg — ABNORMAL LOW (ref 26.0–34.0)
MCHC: 33.3 g/dL (ref 32.0–36.0)
MCV: 69.4 fL — AB (ref 80.0–100.0)
Monocytes Absolute: 0.8 10*3/uL (ref 0.2–0.9)
Monocytes Relative: 5 %
NEUTROS ABS: 15.3 10*3/uL — AB (ref 1.4–6.5)
NEUTROS PCT: 83 %
PLATELETS: 216 10*3/uL (ref 150–440)
RBC: 4.34 MIL/uL (ref 3.80–5.20)
RDW: 15.1 % — ABNORMAL HIGH (ref 11.5–14.5)
WBC: 18.2 10*3/uL — AB (ref 3.6–11.0)

## 2014-12-10 NOTE — Progress Notes (Signed)
Admit Date: 12/07/2014 Today's Date: 12/10/2014  Subjective: Postpartum Day 2: Cesarean Delivery Patient reports min incisional pain, tolerating PO, + flatus and no problems voiding.    Objective: Vital signs in last 24 hours: Temp:  [97.7 F (36.5 C)-98.9 F (37.2 C)] 98.3 F (36.8 C) (09/04 0415) Pulse Rate:  [90-98] 98 (09/04 0415) Resp:  [16-18] 18 (09/04 0415) BP: (87-97)/(47-55) 93/55 mmHg (09/04 0415) SpO2:  [95 %-98 %] 98 % (09/04 0415)  Physical Exam:  General: alert and cooperative Lochia: appropriate Uterine Fundus: firm Incision: healing well DVT Evaluation: No evidence of DVT seen on physical exam.   Recent Labs  12/09/14 0512 12/10/14 0445  HGB 11.2* 10.0*  HCT 34.3* 30.1*    Assessment/Plan: Status post Cesarean section. Doing well postoperatively.  Continue current care.  Jermane Brayboy PAUL 12/10/2014, 12:08 PM

## 2014-12-10 NOTE — Plan of Care (Signed)
Problem: Discharge Progression Outcomes Goal: Activity appropriate for discharge plan Outcome: Progressing Needs to ambulate more (outside of room)

## 2014-12-11 MED ORDER — HYDROCODONE-ACETAMINOPHEN 5-325 MG PO TABS: 1.0000 | ORAL_TABLET | ORAL | Status: DC | PRN

## 2014-12-11 MED ORDER — IBUPROFEN 600 MG PO TABS: 600.0000 mg | ORAL_TABLET | Freq: Four times a day (QID) | ORAL | Status: DC | PRN

## 2014-12-11 NOTE — Discharge Instructions (Signed)
Discharge instructions:  ° °Call office if you have any of the following: headache, visual changes, fever >100 F, chills, breast concerns, excessive vaginal bleeding, incision drainage or problems, leg pain or redness, depression or any other concerns.  ° °Activity: Do not lift > 10 lbs for 6 weeks.  °No intercourse or tampons for 6 weeks.  °No driving for 1-2 weeks.  ° °Call your doctor for increased pain or vaginal bleeding, temperature above 100.4, depression, or concerns.  No strenuous activity or heavy lifting for 6 weeks.  No intercourse, tampons, douching, or enemas for 6 weeks.  No tub baths-showers only.  No driving for 2 weeks or while taking pain medications.  Continue prenatal vitamin and iron.  Increase calories and fluids while breastfeeding. °

## 2014-12-12 ENCOUNTER — Encounter: Payer: Self-pay | Admitting: Obstetrics and Gynecology

## 2014-12-13 LAB — SURGICAL PATHOLOGY

## 2016-07-14 ENCOUNTER — Encounter: Payer: Self-pay | Admitting: Obstetrics and Gynecology

## 2016-07-14 ENCOUNTER — Ambulatory Visit (INDEPENDENT_AMBULATORY_CARE_PROVIDER_SITE_OTHER): Payer: 59 | Admitting: Obstetrics and Gynecology

## 2016-07-14 VITALS — BP 126/76 | HR 71 | Ht 58.5 in | Wt 158.0 lb

## 2016-07-14 DIAGNOSIS — Z124 Encounter for screening for malignant neoplasm of cervix: Secondary | ICD-10-CM | POA: Diagnosis not present

## 2016-07-14 DIAGNOSIS — Z3041 Encounter for surveillance of contraceptive pills: Secondary | ICD-10-CM

## 2016-07-14 DIAGNOSIS — Z1151 Encounter for screening for human papillomavirus (HPV): Secondary | ICD-10-CM | POA: Diagnosis not present

## 2016-07-14 DIAGNOSIS — Z01419 Encounter for gynecological examination (general) (routine) without abnormal findings: Secondary | ICD-10-CM

## 2016-07-14 DIAGNOSIS — N926 Irregular menstruation, unspecified: Secondary | ICD-10-CM

## 2016-07-14 LAB — POCT URINE PREGNANCY: PREG TEST UR: NEGATIVE

## 2016-07-14 MED ORDER — NORETHINDRONE ACET-ETHINYL EST 1-20 MG-MCG PO TABS: 1.0000 | ORAL_TABLET | Freq: Every day | ORAL | 12 refills | Status: DC

## 2016-07-14 NOTE — Progress Notes (Signed)
HPI:      [name] is a 36 y.o. G2P1011 who LMP was Patient's last menstrual period was 06/06/2016., presents today for her annual examination.  Her menses are regular every 28-30 days, lasting 7 days.  Dysmenorrhea none. She does not have intermenstrual bleeding. She ran out of OCPs and took Plan B 3/18. She had irregular bleeding after but restarted OCPs.  Sex activity: single partner, contraception - OCP (estrogen/progesterone).  Last Pap: May 02, 2013  Results were: no abnormalities /neg HPV DNA  Hx of STDs: none   There is no FH of breast cancer. There is no FH of ovarian cancer. The patient does not do self-breast exams.  Tobacco use: The patient denies current or previous tobacco use. Alcohol use: social drinker Exercise: not active  She does get adequate calcium and Vitamin D in her diet.   History reviewed. No pertinent past medical history.  Past Surgical History:  Procedure Laterality Date  . CESAREAN SECTION N/A 12/08/2014   Procedure: PRIMARY CESAREAN SECTION;  Surgeon: Conard Novak, MD;  Location: [facility] ORS;  Service: Obstetrics;  Laterality: N/A;  . WISDOM TOOTH EXTRACTION      History reviewed. No pertinent family history.   ROS:  Review of Systems  Constitutional: Negative for fever, malaise/fatigue and weight loss.  HENT: Negative for congestion, ear pain and sinus pain.   Respiratory: Negative for cough, shortness of breath and wheezing.   Cardiovascular: Negative for chest pain, orthopnea and leg swelling.  Gastrointestinal: Negative for constipation, diarrhea, nausea and vomiting.  Genitourinary: Negative for dysuria, frequency, hematuria and urgency.       Breast ROS: negative   Musculoskeletal: Negative for back pain, joint pain and myalgias.  Skin: Negative for itching and rash.  Neurological: Negative for dizziness, tingling, focal weakness and headaches.  Endo/Heme/Allergies: Negative for environmental allergies. Does not  bruise/bleed easily.  Psychiatric/Behavioral: Negative for depression and suicidal ideas. The patient is not nervous/anxious and does not have insomnia.     Objective: BP 126/76 (BP Location: Left Arm, Patient Position: Sitting, Cuff Size: Normal)   Pulse 71   Ht 4' 10.5" (1.486 m)   Wt 158 lb (71.7 kg)   LMP 06/06/2016   Breastfeeding? No   BMI 32.46 kg/m    Physical Exam  Constitutional: She is oriented to person, place, and time. She appears well-developed and well-nourished.  Genitourinary: Vagina normal and uterus normal. No erythema or tenderness in the vagina. No vaginal discharge found. Right adnexum does not display mass and does not display tenderness. Left adnexum does not display mass and does not display tenderness. Cervix does not exhibit motion tenderness or polyp. Uterus is not enlarged or tender.  Neck: Normal range of motion. No thyromegaly present.  Cardiovascular: Normal rate, regular rhythm and normal heart sounds.   No murmur heard. Pulmonary/Chest: Effort normal and breath sounds normal. Right breast exhibits no mass, no nipple discharge, no skin change and no tenderness. Left breast exhibits no mass, no nipple discharge, no skin change and no tenderness.  Abdominal: Soft. There is no tenderness. There is no guarding.  Musculoskeletal: Normal range of motion.  Neurological: She is alert and oriented to person, place, and time. No cranial nerve deficit.  Psychiatric: She has a normal mood and affect. Her behavior is normal.  Vitals reviewed.   Assessment/Plan: Encounter for annual routine gynecological examination  Cervical cancer screening - Plan: IGP, Aptima HPV  Screening for HPV (human papillomavirus) - Plan:  IGP, Aptima HPV  Encounter for surveillance of contraceptive pills - OCP RF eRxd. - Plan: norethindrone-ethinyl estradiol (MICROGESTIN,JUNEL,LOESTRIN) 1-20 MG-MCG tablet, DISCONTINUED: norethindrone-ethinyl estradiol (MICROGESTIN,JUNEL,LOESTRIN) 1-20  MG-MCG tablet, DISCONTINUED: norethindrone-ethinyl estradiol (MICROGESTIN,JUNEL,LOESTRIN) 1-20 MG-MCG tablet  Irregular menses - Off OCPs after Plan B. Neg UPT today. Cont OCPs. Reassurance. - Plan: POCT urine pregnancy   GYN counsel adequate intake of calcium and vitamin D, diet and exercise     F/U  Return in about 1 year (around 07/14/2017).  Christon Parada B. Saige Busby, PA-C 07/14/2016 10:46 AM

## 2016-07-17 LAB — IGP, APTIMA HPV
HPV APTIMA: NEGATIVE
PAP Smear Comment: 0

## 2017-05-20 ENCOUNTER — Other Ambulatory Visit: Payer: Self-pay | Admitting: Obstetrics and Gynecology

## 2017-05-20 DIAGNOSIS — Z3041 Encounter for surveillance of contraceptive pills: Secondary | ICD-10-CM

## 2017-07-10 ENCOUNTER — Telehealth: Payer: Self-pay

## 2017-07-10 DIAGNOSIS — Z3041 Encounter for surveillance of contraceptive pills: Secondary | ICD-10-CM

## 2017-07-10 NOTE — Telephone Encounter (Signed)
Pt would [facility] to [facility] [address] and have her Acupuncturist(microgestin) resent to that pharmacy. Cb#[phone]

## 2017-07-14 MED ORDER — NORETHINDRONE ACET-ETHINYL EST 1-20 MG-MCG PO TABS: 1.0000 | ORAL_TABLET | Freq: Every day | ORAL | 2 refills | Status: DC

## 2017-09-21 ENCOUNTER — Other Ambulatory Visit: Payer: Self-pay | Admitting: Obstetrics and Gynecology

## 2017-09-21 DIAGNOSIS — Z3041 Encounter for surveillance of contraceptive pills: Secondary | ICD-10-CM

## 2017-09-22 ENCOUNTER — Other Ambulatory Visit: Payer: Self-pay

## 2017-10-05 ENCOUNTER — Other Ambulatory Visit: Payer: Self-pay

## 2017-10-05 DIAGNOSIS — Z3041 Encounter for surveillance of contraceptive pills: Secondary | ICD-10-CM

## 2017-10-05 MED ORDER — NORETHINDRONE ACET-ETHINYL EST 1-20 MG-MCG PO TABS: 1.0000 | ORAL_TABLET | Freq: Every day | ORAL | 1 refills | Status: DC

## 2017-10-05 NOTE — Telephone Encounter (Signed)
Pt sched annual for 7/22 and needs refill of bc.  [phone]  Pt aware refill eRx'd.

## 2017-10-12 ENCOUNTER — Other Ambulatory Visit: Payer: Self-pay

## 2017-10-12 DIAGNOSIS — Z3041 Encounter for surveillance of contraceptive pills: Secondary | ICD-10-CM

## 2017-10-12 MED ORDER — NORETHINDRONE ACET-ETHINYL EST 1-20 MG-MCG PO TABS: 1.0000 | ORAL_TABLET | Freq: Every day | ORAL | 1 refills | Status: DC

## 2017-10-12 NOTE — Telephone Encounter (Signed)
Can you resend [phone] bc to pharmacy? Her Annual is scheduled

## 2017-10-26 ENCOUNTER — Ambulatory Visit (INDEPENDENT_AMBULATORY_CARE_PROVIDER_SITE_OTHER): Payer: Managed Care, Other (non HMO) | Admitting: Obstetrics and Gynecology

## 2017-10-26 ENCOUNTER — Encounter: Payer: Self-pay | Admitting: Obstetrics and Gynecology

## 2017-10-26 VITALS — BP 110/72 | HR 77 | Ht 58.5 in | Wt 165.5 lb

## 2017-10-26 DIAGNOSIS — Z3041 Encounter for surveillance of contraceptive pills: Secondary | ICD-10-CM

## 2017-10-26 DIAGNOSIS — Z01419 Encounter for gynecological examination (general) (routine) without abnormal findings: Secondary | ICD-10-CM | POA: Diagnosis not present

## 2017-10-26 MED ORDER — NORETHINDRONE ACET-ETHINYL EST 1-20 MG-MCG PO TABS: 1.0000 | ORAL_TABLET | Freq: Every day | ORAL | 4 refills | Status: DC

## 2017-10-26 NOTE — Progress Notes (Signed)
Chief Complaint  Patient presents with  . Gynecologic Exam     HPI:      [name] is a 37 y.o. G2P1011 who LMP was Patient's last menstrual period was 09/21/2017 (approximate)., presents today for her annual examination. Her menses are regular every 28-30 days, lasting 7 days. Dysmenorrhea none. She does not have intermenstrual bleeding. She has done continuous dosing occas and wants to start that.   Sex activity: single partner, contraception - OCP (estrogen/progesterone).  Last Pap: 07/14/16  Results were: no abnormalities /neg HPV DNA  Hx of STDs: none  There is no FH of breast cancer. There is no FH of ovarian cancer. The patient does not do self-breast exams.  Tobacco use: The patient denies current or previous tobacco use. Alcohol use: social drinker Exercise: not active  She does get adequate calcium and Vitamin D in her diet.   Past Medical History:  Diagnosis Date  . Esophagitis   . Gastritis     Past Surgical History:  Procedure Laterality Date  . CESAREAN SECTION N/A 12/08/2014   Procedure: PRIMARY CESAREAN SECTION;  Surgeon: Conard NovakStephen D Jackson, MD;  Location: [facility] ORS;  Service: Obstetrics;  Laterality: N/A;  . WISDOM TOOTH EXTRACTION      Family History  Problem Relation Age of Onset  . Kidney failure Father   . Heart Problems Father   . Hypertension Father   . Hyperlipidemia Father   . Stroke Sister [id number]  . Breast cancer Neg Hx   . Ovarian cancer Neg Hx     Social History   Socioeconomic History  . Marital status: Married    Spouse name: Not on file  . Number of children: Not on file  . Years of education: Not on file  . Highest education level: Not on file  Occupational History  . Not on file  Social Needs  . Financial resource strain: Not on file  . Food insecurity:    Worry: Not on file    Inability: Not on file  . Transportation needs:    Medical: Not on file    Non-medical: Not on file  Tobacco Use  . Smoking status:  Never Smoker  . Smokeless tobacco: Never Used  Substance and Sexual Activity  . Alcohol use: No  . Drug use: No  . Sexual activity: Yes    Birth control/protection: Pill  Lifestyle  . Physical activity:    Days per week: Not on file    Minutes per session: Not on file  . Stress: Not on file  Relationships  . Social connections:    Talks on phone: Not on file    Gets together: Not on file    Attends religious service: Not on file    Active member of club or organization: Not on file    Attends meetings of clubs or organizations: Not on file    Relationship status: Not on file  . Intimate partner violence:    Fear of current or ex partner: Not on file    Emotionally abused: Not on file    Physically abused: Not on file    Forced sexual activity: Not on file  Other Topics Concern  . Not on file  Social History Narrative  . Not on file    Current Outpatient Medications on File Prior to Visit  Medication Sig Dispense Refill  . ferrous sulfate 325 (65 FE) MG tablet Take by mouth.    . pantoprazole (PROTONIX) 40  MG tablet Take by mouth.    Marland Kitchen ibuprofen (ADVIL,MOTRIN) 600 MG tablet Take 1 tablet (600 mg total) by mouth every 6 (six) hours as needed. (Patient not taking: Reported on 07/14/2016) 45 tablet 0  . Prenatal Vit-Fe Fumarate-FA (MULTIVITAMIN-PRENATAL) 27-0.8 MG TABS tablet Take 1 tablet by mouth daily at 12 noon.     No current facility-administered medications on file prior to visit.     ROS:  Review of Systems  Constitutional: Negative for fatigue, fever and unexpected weight change.  Respiratory: Negative for cough, shortness of breath and wheezing.   Cardiovascular: Negative for chest pain, palpitations and leg swelling.  Gastrointestinal: Negative for blood in stool, constipation, diarrhea, nausea and vomiting.  Endocrine: Negative for cold intolerance, heat intolerance and polyuria.  Genitourinary: Negative for dyspareunia, dysuria, flank pain, frequency, genital  sores, hematuria, menstrual problem, pelvic pain, urgency, vaginal bleeding, vaginal discharge and vaginal pain.  Musculoskeletal: Negative for back pain, joint swelling and myalgias.  Skin: Negative for rash.  Neurological: Negative for dizziness, syncope, light-headedness, numbness and headaches.  Hematological: Negative for adenopathy.  Psychiatric/Behavioral: Negative for agitation, confusion, sleep disturbance and suicidal ideas. The patient is not nervous/anxious.     Objective: BP 110/72   Pulse 77   Ht 4' 10.5" (1.486 m)   Wt 165 lb 8 oz (75.1 kg)   LMP 09/21/2017 (Approximate)   BMI 34.00 kg/m    Physical Exam  Constitutional: She is oriented to person, place, and time. She appears well-developed and well-nourished.  Genitourinary: Vagina normal and uterus normal. There is no rash or tenderness on the right labia. There is no rash or tenderness on the left labia. No erythema or tenderness in the vagina. No vaginal discharge found. Right adnexum does not display mass and does not display tenderness. Left adnexum does not display mass and does not display tenderness. Cervix does not exhibit motion tenderness or polyp. Uterus is not enlarged or tender.  Neck: Normal range of motion. No thyromegaly present.  Cardiovascular: Normal rate, regular rhythm and normal heart sounds.  No murmur heard. Pulmonary/Chest: Effort normal and breath sounds normal. Right breast exhibits no mass, no nipple discharge, no skin change and no tenderness. Left breast exhibits no mass, no nipple discharge, no skin change and no tenderness.  Abdominal: Soft. There is no tenderness. There is no guarding.  Musculoskeletal: Normal range of motion.  Neurological: She is alert and oriented to person, place, and time. No cranial nerve deficit.  Psychiatric: She has a normal mood and affect. Her behavior is normal.  Vitals reviewed.   Assessment/Plan: Encounter for annual routine gynecological  examination  Encounter for surveillance of contraceptive pills - OCP RF - Plan: norethindrone-ethinyl estradiol (MICROGESTIN) 1-20 MG-MCG tablet  Meds ordered this encounter  Medications  . norethindrone-ethinyl estradiol (MICROGESTIN) 1-20 MG-MCG tablet    Sig: Take 1 tablet by mouth daily. CONTINUOUS DOSING    Dispense:  84 tablet    Refill:  4    Please consider 90 day supplies to promote better adherence    Order Specific Question:   Supervising Provider    Answer:   Nadara Mustard [[id number]]             GYN counsel adequate intake of calcium and vitamin D, diet and exercise     F/U  Return in about 1 year (around 10/27/2018).  Lydiann Bonifas B. Lillianne Eick, PA-C 10/26/2017 10:42 AM

## 2017-10-26 NOTE — Patient Instructions (Signed)
I value your feedback and entrusting us with your care. If you get a [facility] patient survey, I would appreciate you taking the time to let us know about your experience today. Thank you! 

## 2018-01-26 ENCOUNTER — Telehealth: Payer: Self-pay

## 2018-01-26 NOTE — Telephone Encounter (Signed)
Patient schedule 01/27/18 with ABC

## 2018-01-26 NOTE — Telephone Encounter (Signed)
Pt wanting to schedule appointment for possible UTI & a smell. ZO#[phone]

## 2018-01-27 ENCOUNTER — Encounter: Payer: Self-pay | Admitting: Obstetrics and Gynecology

## 2018-01-27 ENCOUNTER — Ambulatory Visit (INDEPENDENT_AMBULATORY_CARE_PROVIDER_SITE_OTHER): Payer: Managed Care, Other (non HMO) | Admitting: Obstetrics and Gynecology

## 2018-01-27 VITALS — BP 120/60 | HR 78 | Ht <= 58 in | Wt 164.0 lb

## 2018-01-27 DIAGNOSIS — N3001 Acute cystitis with hematuria: Secondary | ICD-10-CM | POA: Diagnosis not present

## 2018-01-27 LAB — POCT URINALYSIS DIPSTICK
Bilirubin, UA: NEGATIVE
GLUCOSE UA: NEGATIVE
Ketones, UA: NEGATIVE
NITRITE UA: NEGATIVE
PH UA: 6 (ref 5.0–8.0)
PROTEIN UA: NEGATIVE
SPEC GRAV UA: 1.02 (ref 1.010–1.025)

## 2018-01-27 MED ORDER — NITROFURANTOIN MONOHYD MACRO 100 MG PO CAPS: 100.0000 mg | ORAL_CAPSULE | Freq: Two times a day (BID) | ORAL | 0 refills | Status: AC

## 2018-01-27 NOTE — Progress Notes (Signed)
System, Pcp Not In   Chief Complaint  Patient presents with  . Urinary Tract Infection    burning/sting sensation, mild odor, unsure if she saw blood, no frequency x 3 days    HPI:      [name] is a 37 y.o. G2P1011 who LMP was No LMP recorded. (Menstrual status: Oral contraceptives)., presents today for dysuria, hematuria, frequency with good flow and urine odor for the past 4 days. No vag bleeding, vag sx. Hx of UTIs in distant past. No recent abx use. No LBP, belly pain, fevers.   Past Medical History:  Diagnosis Date  . Esophagitis   . Gastritis   . Thalassanemia    alpha thalassemia per labs; seeing [facility] genetic counselor    Past Surgical History:  Procedure Laterality Date  . CESAREAN SECTION N/A 12/08/2014   Procedure: PRIMARY CESAREAN SECTION;  Surgeon: Conard Novak, MD;  Location: [facility] ORS;  Service: Obstetrics;  Laterality: N/A;  . WISDOM TOOTH EXTRACTION      Family History  Problem Relation Age of Onset  . Kidney failure Father   . Heart Problems Father   . Hypertension Father   . Hyperlipidemia Father   . Stroke Sister 60  . Asthma Brother   . Breast cancer Neg Hx   . Ovarian cancer Neg Hx     Social History   Socioeconomic History  . Marital status: Married    Spouse name: Not on file  . Number of children: Not on file  . Years of education: Not on file  . Highest education level: Not on file  Occupational History  . Not on file  Social Needs  . Financial resource strain: Not on file  . Food insecurity:    Worry: Not on file    Inability: Not on file  . Transportation needs:    Medical: Not on file    Non-medical: Not on file  Tobacco Use  . Smoking status: Never Smoker  . Smokeless tobacco: Never Used  Substance and Sexual Activity  . Alcohol use: No  . Drug use: No  . Sexual activity: Yes    Birth control/protection: Pill  Lifestyle  . Physical activity:    Days per week: Not on file    Minutes per session: Not on  file  . Stress: Not on file  Relationships  . Social connections:    Talks on phone: Not on file    Gets together: Not on file    Attends religious service: Not on file    Active member of club or organization: Not on file    Attends meetings of clubs or organizations: Not on file    Relationship status: Not on file  . Intimate partner violence:    Fear of current or ex partner: Not on file    Emotionally abused: Not on file    Physically abused: Not on file    Forced sexual activity: Not on file  Other Topics Concern  . Not on file  Social History Narrative  . Not on file    Outpatient Medications Prior to Visit  Medication Sig Dispense Refill  . ferrous sulfate 325 (65 FE) MG tablet Take by mouth.    . norethindrone-ethinyl estradiol (MICROGESTIN) 1-20 MG-MCG tablet Take 1 tablet by mouth daily. CONTINUOUS DOSING 84 tablet 4  . pantoprazole (PROTONIX) 40 MG tablet Take by mouth.    Marland Kitchen ibuprofen (ADVIL,MOTRIN) 600 MG tablet Take 1 tablet (600 mg  total) by mouth every 6 (six) hours as needed. (Patient not taking: Reported on 07/14/2016) 45 tablet 0  . Prenatal Vit-Fe Fumarate-FA (MULTIVITAMIN-PRENATAL) 27-0.8 MG TABS tablet Take 1 tablet by mouth daily at 12 noon.     No facility-administered medications prior to visit.       ROS:  Review of Systems  Constitutional: Negative for fever.  Gastrointestinal: Negative for blood in stool, constipation, diarrhea, nausea and vomiting.  Genitourinary: Positive for dysuria, frequency and hematuria. Negative for dyspareunia, flank pain, urgency, vaginal bleeding, vaginal discharge and vaginal pain.  Musculoskeletal: Negative for back pain.  Skin: Negative for rash.     OBJECTIVE:   Vitals:  BP 120/60   Pulse 78   Ht 4\' 10"  (1.473 m)   Wt 164 lb (74.4 kg)   BMI 34.28 kg/m   Physical Exam  Constitutional: She is oriented to person, place, and time. She appears well-developed.  Neck: Normal range of motion.  Pulmonary/Chest:  Effort normal.  Abdominal: There is no CVA tenderness.  Neurological: She is alert and oriented to person, place, and time. No cranial nerve deficit.  Psychiatric: She has a normal mood and affect. Her behavior is normal. Judgment and thought content normal.  Vitals reviewed.   Results: Results for orders placed or performed in visit on 01/27/18 (from the past 24 hour(s))  POCT Urinalysis Dipstick     Status: Abnormal   Collection Time: 01/27/18  3:53 PM  Result Value Ref Range   Color, UA straw    Clarity, UA clear    Glucose, UA Negative Negative   Bilirubin, UA neg    Ketones, UA neg    Spec Grav, UA 1.020 1.010 - 1.025   Blood, UA mod    pH, UA 6.0 5.0 - 8.0   Protein, UA Negative Negative   Urobilinogen, UA     Nitrite, UA neg    Leukocytes, UA Small (1+) (A) Negative   Appearance     Odor       Assessment/Plan: Acute cystitis with hematuria - Pos sx/UA. Rx macrobid. Check C&S. F/u prn.  - Plan: POCT Urinalysis Dipstick, nitrofurantoin, macrocrystal-monohydrate, (MACROBID) 100 MG capsule, Urine Culture    Meds ordered this encounter  Medications  . nitrofurantoin, macrocrystal-monohydrate, (MACROBID) 100 MG capsule    Sig: Take 1 capsule (100 mg total) by mouth 2 (two) times daily for 5 days.    Dispense:  10 capsule    Refill:  0    Order Specific Question:   Supervising Provider    Answer:   Nadara Mustard [[id number]]      Return if symptoms worsen or fail to improve.  Idy Rawling B. Crisol Muecke, PA-C 01/27/2018 3:54 PM

## 2018-01-27 NOTE — Patient Instructions (Signed)
I value your feedback and entrusting us with your care. If you get a [facility] patient survey, I would appreciate you taking the time to let us know about your experience today. Thank you! 

## 2018-01-29 LAB — URINE CULTURE: Organism ID, Bacteria: NO GROWTH

## 2018-02-02 ENCOUNTER — Telehealth: Payer: Self-pay | Admitting: Obstetrics and Gynecology

## 2018-02-02 NOTE — Telephone Encounter (Signed)
Pt aware of neg C&S. UTI sx resolved. Given hematuria and neg C&S, pt to RTO with RN for UA for blood. Pt to sched.

## 2018-02-02 NOTE — Telephone Encounter (Signed)
Patient is calling for labs results. Please advise. 

## 2018-02-03 ENCOUNTER — Other Ambulatory Visit (INDEPENDENT_AMBULATORY_CARE_PROVIDER_SITE_OTHER): Payer: Managed Care, Other (non HMO)

## 2018-02-03 DIAGNOSIS — R3121 Asymptomatic microscopic hematuria: Secondary | ICD-10-CM

## 2018-02-03 LAB — POCT URINALYSIS DIPSTICK
Blood, UA: 2
Ketones, UA: 1
PROTEIN UA: POSITIVE — AB

## 2018-02-03 NOTE — Patient Instructions (Signed)
I value your feedback and entrusting us with your care. If you get a [facility] patient survey, I would appreciate you taking the time to let us know about your experience today. Thank you! 

## 2018-02-03 NOTE — Progress Notes (Signed)
Pt here for UA due to hematuria on urine check 01/27/18 when having UTI sx. C&S was neg, so pt needs repeat UA. No vag bleeding/spotting. UTI sx resolved after abx tx.  Results for orders placed or performed in visit on 02/03/18 (from the past 24 hour(s))  POCT Urinalysis Dipstick     Status: Abnormal   Collection Time: 02/03/18  3:32 PM  Result Value Ref Range   Color, UA     Clarity, UA     Glucose, UA     Bilirubin, UA     Ketones, UA 1    Spec Grav, UA     Blood, UA 2    pH, UA     Protein, UA Positive (A) Negative   Urobilinogen, UA     Nitrite, UA     Leukocytes, UA     Appearance     Odor      Given persistent hematuria, will check CMP and pt to RTO in 4 wks for rechk.

## 2018-02-04 LAB — COMPREHENSIVE METABOLIC PANEL
ALBUMIN: 4.2 g/dL (ref 3.5–5.5)
ALK PHOS: 48 IU/L (ref 39–117)
ALT: 9 IU/L (ref 0–32)
AST: 15 IU/L (ref 0–40)
Albumin/Globulin Ratio: 1.6 (ref 1.2–2.2)
BILIRUBIN TOTAL: 0.2 mg/dL (ref 0.0–1.2)
BUN/Creatinine Ratio: 18 (ref 9–23)
BUN: 13 mg/dL (ref 6–20)
CHLORIDE: 100 mmol/L (ref 96–106)
CO2: 23 mmol/L (ref 20–29)
Calcium: 9.1 mg/dL (ref 8.7–10.2)
Creatinine, Ser: 0.71 mg/dL (ref 0.57–1.00)
GFR calc non Af Amer: 108 mL/min/{1.73_m2} (ref >59)
GFR, EST AFRICAN AMERICAN: 125 mL/min/{1.73_m2} (ref >59)
GLOBULIN, TOTAL: 2.7 g/dL (ref 1.5–4.5)
Glucose: 78 mg/dL (ref 65–99)
POTASSIUM: 4 mmol/L (ref 3.5–5.2)
SODIUM: 137 mmol/L (ref 134–144)
Total Protein: 6.9 g/dL (ref 6.0–8.5)

## 2018-02-04 NOTE — Progress Notes (Signed)
Please let pt know kidney function tests are normal. Will recheck UA in 1 mo. Can do same thing--come in with nurse for UA check. Thx.

## 2018-02-04 NOTE — Progress Notes (Signed)
Pt aware.

## 2019-01-06 DIAGNOSIS — R319 Hematuria, unspecified: Secondary | ICD-10-CM

## 2019-01-06 HISTORY — DX: Hematuria, unspecified: R31.9

## 2019-01-10 ENCOUNTER — Ambulatory Visit (INDEPENDENT_AMBULATORY_CARE_PROVIDER_SITE_OTHER): Payer: Managed Care, Other (non HMO) | Admitting: Obstetrics and Gynecology

## 2019-01-10 ENCOUNTER — Other Ambulatory Visit: Payer: Self-pay

## 2019-01-10 ENCOUNTER — Encounter: Payer: Self-pay | Admitting: Obstetrics and Gynecology

## 2019-01-10 VITALS — BP 100/70 | Ht 58.5 in | Wt 159.0 lb

## 2019-01-10 DIAGNOSIS — Z01419 Encounter for gynecological examination (general) (routine) without abnormal findings: Secondary | ICD-10-CM | POA: Diagnosis not present

## 2019-01-10 DIAGNOSIS — R3121 Asymptomatic microscopic hematuria: Secondary | ICD-10-CM | POA: Diagnosis not present

## 2019-01-10 DIAGNOSIS — Z3041 Encounter for surveillance of contraceptive pills: Secondary | ICD-10-CM

## 2019-01-10 DIAGNOSIS — Z23 Encounter for immunization: Secondary | ICD-10-CM

## 2019-01-10 LAB — POCT URINALYSIS DIPSTICK
Bilirubin, UA: NEGATIVE
Glucose, UA: NEGATIVE
Ketones, UA: NEGATIVE
Leukocytes, UA: NEGATIVE
Nitrite, UA: NEGATIVE
Spec Grav, UA: 1.02 (ref 1.010–1.025)
pH, UA: 6 (ref 5.0–8.0)

## 2019-01-10 MED ORDER — NORETHINDRONE ACET-ETHINYL EST 1-20 MG-MCG PO TABS: 1.0000 | ORAL_TABLET | Freq: Every day | ORAL | 4 refills | Status: DC

## 2019-01-10 NOTE — Patient Instructions (Signed)
I value your feedback and entrusting us with your care. If you get a [facility] patient survey, I would appreciate you taking the time to let us know about your experience today. Thank you! 

## 2019-01-10 NOTE — Progress Notes (Signed)
Chief Complaint  Patient presents with  . Gynecologic Exam    flu shot today  . [employer] Employee     HPI:      [name] is a 38 y.o. G2P1011 who LMP was No LMP recorded. (Menstrual status: Oral contraceptives)., presents today for her annual examination. Her menses are infrequent due to cont dosing of OCPs, takes active pills until starts bleeding, bleeding lasting 7 days. Dysmenorrhea none. She does not have intermenstrual bleeding.   Sex activity: single partner, contraception - OCP (estrogen/progesterone).  Last Pap: 07/14/16  Results were: no abnormalities /neg HPV DNA  Hx of STDs: none  There is no FH of breast cancer. There is no FH of ovarian cancer. The patient does not do self-breast exams.  Tobacco use: The patient denies current or previous tobacco use. Alcohol use: social drinker No drug use. Exercise: mod active  She does get adequate calcium and Vitamin D in her diet.  Had hematuria on UA 10/19 with neg C&S. Was to have UA recheck but not done. Had normal CMP 10/19. Had UTI Labor Day wknd and did MDLive appt, treated with abx, feeling better. No sx today.   Past Medical History:  Diagnosis Date  . Esophagitis   . Gastritis   . Thalassanemia    alpha thalassemia per labs; seeing [facility] genetic counselor    Past Surgical History:  Procedure Laterality Date  . CESAREAN SECTION N/A 12/08/2014   Procedure: PRIMARY CESAREAN SECTION;  Surgeon: Will Bonnet, MD;  Location: [facility] ORS;  Service: Obstetrics;  Laterality: N/A;  . WISDOM TOOTH EXTRACTION      Family History  Problem Relation Age of Onset  . Kidney failure Father   . Heart Problems Father   . Hypertension Father   . Hyperlipidemia Father   . Stroke Sister 51  . Asthma Brother   . Breast cancer Neg Hx   . Ovarian cancer Neg Hx     Social History   Socioeconomic History  . Marital status: Married    Spouse name: Not on file  . Number of children: Not on file  . Years of  education: Not on file  . Highest education level: Not on file  Occupational History  . Not on file  Social Needs  . Financial resource strain: Not on file  . Food insecurity    Worry: Not on file    Inability: Not on file  . Transportation needs    Medical: Not on file    Non-medical: Not on file  Tobacco Use  . Smoking status: Never Smoker  . Smokeless tobacco: Never Used  Substance and Sexual Activity  . Alcohol use: No  . Drug use: No  . Sexual activity: Yes    Birth control/protection: Pill  Lifestyle  . Physical activity    Days per week: Not on file    Minutes per session: Not on file  . Stress: Not on file  Relationships  . Social Herbalist on phone: Not on file    Gets together: Not on file    Attends religious service: Not on file    Active member of club or organization: Not on file    Attends meetings of clubs or organizations: Not on file    Relationship status: Not on file  . Intimate partner violence    Fear of current or ex partner: Not on file    Emotionally abused: Not on file  Physically abused: Not on file    Forced sexual activity: Not on file  Other Topics Concern  . Not on file  Social History Narrative  . Not on file    Current Outpatient Medications on File Prior to Visit  Medication Sig Dispense Refill  . pantoprazole (PROTONIX) 40 MG tablet Take by mouth.    . ferrous sulfate 325 (65 FE) MG tablet Take by mouth.     No current facility-administered medications on file prior to visit.     ROS:  Review of Systems  Constitutional: Negative for fatigue, fever and unexpected weight change.  Respiratory: Negative for cough, shortness of breath and wheezing.   Cardiovascular: Negative for chest pain, palpitations and leg swelling.  Gastrointestinal: Negative for blood in stool, constipation, diarrhea, nausea and vomiting.  Endocrine: Negative for cold intolerance, heat intolerance and polyuria.  Genitourinary: Negative for  dyspareunia, flank pain, frequency, genital sores, hematuria, menstrual problem, pelvic pain, urgency, vaginal bleeding, vaginal discharge and vaginal pain.  Musculoskeletal: Negative for back pain, joint swelling and myalgias.  Skin: Negative for rash.  Neurological: Negative for dizziness, syncope, light-headedness, numbness and headaches.  Hematological: Negative for adenopathy.  Psychiatric/Behavioral: Negative for agitation, confusion, sleep disturbance and suicidal ideas. The patient is not nervous/anxious.     Objective: BP 100/70   Ht 4' 10.5" (1.486 m)   Wt 159 lb (72.1 kg)   BMI 32.67 kg/m    Physical Exam Constitutional:      Appearance: She is well-developed.  Genitourinary:     Vulva, vagina, uterus, right adnexa and left adnexa normal.     No vulval lesion or tenderness noted.     No vaginal discharge, erythema or tenderness.     No cervical motion tenderness or polyp.     Uterus is not enlarged or tender.     No right or left adnexal mass present.     Right adnexa not tender.     Left adnexa not tender.  Neck:     Musculoskeletal: Normal range of motion.     Thyroid: No thyromegaly.  Cardiovascular:     Rate and Rhythm: Normal rate and regular rhythm.     Heart sounds: Normal heart sounds. No murmur.  Pulmonary:     Effort: Pulmonary effort is normal.     Breath sounds: Normal breath sounds.  Chest:     Breasts:        Right: No mass, nipple discharge, skin change or tenderness.        Left: No mass, nipple discharge, skin change or tenderness.  Abdominal:     Palpations: Abdomen is soft.     Tenderness: There is no abdominal tenderness. There is no guarding.  Musculoskeletal: Normal range of motion.  Neurological:     General: No focal deficit present.     Mental Status: She is alert and oriented to person, place, and time.     Cranial Nerves: No cranial nerve deficit.  Skin:    General: Skin is warm and dry.  Psychiatric:        Mood and Affect:  Mood normal.        Behavior: Behavior normal.        Thought Content: Thought content normal.        Judgment: Judgment normal.  Vitals signs reviewed.   RESULTS:   Results for orders placed or performed in visit on 01/10/19 (from the past 24 hour(s))  POCT Urinalysis Dipstick     Status:  Abnormal   Collection Time: 01/10/19  4:56 PM  Result Value Ref Range   Color, UA yellow    Clarity, UA clear    Glucose, UA Negative Negative   Bilirubin, UA neg    Ketones, UA neg    Spec Grav, UA 1.020 1.010 - 1.025   Blood, UA mod    pH, UA 6.0 5.0 - 8.0   Protein, UA     Urobilinogen, UA     Nitrite, UA neg    Leukocytes, UA Negative Negative   Appearance     Odor       Assessment/Plan: Encounter for annual routine gynecological examination  Encounter for surveillance of contraceptive pills - OCP RF cont dosing - Plan: norethindrone-ethinyl estradiol (MICROGESTIN) 1-20 MG-MCG tablet  Asymptomatic microscopic hematuria - Plan: POCT Urinalysis Dipstick, Ambulatory referral to Urology; Pos UA today and 10/19. Normal CMP 10/19. Refer to urology for further eval.   Meds ordered this encounter  Medications  . norethindrone-ethinyl estradiol (MICROGESTIN) 1-20 MG-MCG tablet    Sig: Take 1 tablet by mouth daily. CONTINUOUS DOSING    Dispense:  84 tablet    Refill:  4    Order Specific Question:   Supervising Provider    Answer:   Nadara Mustard [[id number]]             GYN counsel adequate intake of calcium and vitamin D, diet and exercise     F/U  Return in about 1 year (around 01/10/2020).  Eunique Balik B. Meleni Delahunt, PA-C 01/10/2019 4:58 PM

## 2019-01-11 NOTE — Addendum Note (Signed)
Addended by: Drenda Freeze on: 01/11/2019 08:02 AM   Modules accepted: Orders

## 2019-02-14 ENCOUNTER — Ambulatory Visit (INDEPENDENT_AMBULATORY_CARE_PROVIDER_SITE_OTHER): Payer: Managed Care, Other (non HMO) | Admitting: Urology

## 2019-02-14 ENCOUNTER — Encounter: Payer: Self-pay | Admitting: Urology

## 2019-02-14 ENCOUNTER — Other Ambulatory Visit: Payer: Self-pay

## 2019-02-14 VITALS — BP 134/82 | HR 83 | Ht <= 58 in | Wt 152.0 lb

## 2019-02-14 DIAGNOSIS — R310 Gross hematuria: Secondary | ICD-10-CM

## 2019-02-14 DIAGNOSIS — N390 Urinary tract infection, site not specified: Secondary | ICD-10-CM | POA: Diagnosis not present

## 2019-02-14 LAB — URINALYSIS, COMPLETE
Bilirubin, UA: NEGATIVE
Glucose, UA: NEGATIVE
Leukocytes,UA: NEGATIVE
Nitrite, UA: NEGATIVE
Protein,UA: NEGATIVE
Specific Gravity, UA: >1.03 — ABNORMAL HIGH (ref 1.005–1.030)
Urobilinogen, Ur: 0.2 mg/dL (ref 0.2–1.0)
pH, UA: 5.5 (ref 5.0–7.5)

## 2019-02-14 LAB — MICROSCOPIC EXAMINATION: Bacteria, UA: NONE SEEN

## 2019-02-14 LAB — BLADDER SCAN AMB NON-IMAGING: Scan Result: 0

## 2019-02-14 NOTE — Progress Notes (Signed)
02/14/19 11:22 AM   Emily Bailey Emily Bailey Nov 13, 1980 [id number]  Referring provider: Chad Cordial, PA-C [address] [place],  [place] [address]  CC: " Asymptomatic microscopic hematuria"  HPI: I saw Emily Bailey in urology clinic in consultation for asymptomatic microscopic hematuria from [place], [place].  She is a very healthy 38 year old female that on recent dipstick urinalysis on 01/10/2019 had " moderate" blood.  On chart review, all her prior urine samples have been dipstick positive for blood, but there have been no microscopic analyses.  She denies any history of gross hematuria.  Her urinalysis today again shows dipstick positive blood, however there is no microscopic hematuria.  Her urinalysis is benign with 0-5 WBCs, 0-2 RBCs, 0-10 epithelial cells, no bacteria, nitrite negative.  She is a non-smoker and denies any carcinogenic exposures.  She denies any significant urinary symptoms.  She has 1-2 UTIs per year.  She denies any flank pain, bone pain, or weight loss.  There are no aggravating or alleviating factors.  Severity is mild.   PMH: Past Medical History:  Diagnosis Date   Esophagitis    Gastritis    Thalassanemia    alpha thalassemia per labs; seeing [facility] genetic counselor    Surgical History: Past Surgical History:  Procedure Laterality Date   CESAREAN SECTION N/A 12/08/2014   Procedure: PRIMARY CESAREAN SECTION;  Surgeon: Will Bonnet, MD;  Location: [facility] ORS;  Service: Obstetrics;  Laterality: N/A;   WISDOM TOOTH EXTRACTION      Allergies: No Known Allergies  Family History: Family History  Problem Relation Age of Onset   Kidney failure Father    Heart Problems Father    Hypertension Father    Hyperlipidemia Father    Stroke Sister 2   Asthma Brother    Breast cancer Neg Hx    Ovarian cancer Neg Hx     Social History:  reports that she has never smoked. She has never used smokeless tobacco. She reports that she does not drink  alcohol or use drugs.  ROS: Please see flowsheet from today's date for complete review of systems.  Physical Exam: BP 134/82    Pulse 83    Ht 4\' 10"  (1.473 m)    Wt 152 lb (68.9 kg)    BMI 31.77 kg/m    Constitutional:  Alert and oriented, No acute distress. Cardiovascular: No clubbing, cyanosis, or edema. Respiratory: Normal respiratory effort, no increased work of breathing. GI: Abdomen is soft, nontender, nondistended, no abdominal masses GU: No CVA tenderness Lymph: No cervical or inguinal lymphadenopathy. Skin: No rashes, bruises or suspicious lesions. Neurologic: Grossly intact, no focal deficits, moving all 4 extremities. Psychiatric: Normal mood and affect.  Laboratory Data: Reviewed Urinalysis today dipstick positive blood, 0-5 WBCs, 0-2 RBCs, no bacteria, nitrite negative  Pertinent Imaging: None to review  Assessment & Plan:   In summary, the patient is a healthy 38 year old female with dipstick positive blood in the urine, but negative microscopic urinalysis.  We discussed the differences between dipstick positive blood and true microscopic hematuria at length, and reviewed her urinalysis today which is very reassuring.  Per AUA guidelines, she does not require any further work-up with CT, renal ultrasound, or cystoscopy.  Additionally, she has no risk factors for urothelial malignancy.  We discussed that if she were to develop gross hematuria or microscopic hematuria with greater than 3 RBCs per high-power field this would warrant further work-up with cystoscopy and renal ultrasound in her age group.  Finally,  we discussed UTI prevention including behavioral strategies and cranberry tablet prophylaxis.  Follow-up as needed  Sondra Come, MD  [facility] [address], [address] [place], [place] [address] [phone]

## 2019-03-09 ENCOUNTER — Other Ambulatory Visit: Payer: Self-pay

## 2019-03-09 DIAGNOSIS — Z20822 Contact with and (suspected) exposure to covid-19: Secondary | ICD-10-CM

## 2019-03-11 LAB — NOVEL CORONAVIRUS, NAA: SARS-CoV-2, NAA: NOT DETECTED

## 2019-10-07 ENCOUNTER — Telehealth: Payer: Self-pay

## 2019-10-07 NOTE — Telephone Encounter (Signed)
Pt calling; has d/c; is greenish yellow liquidy; no odor.  Be seen?  [phone] Pt states no itching or burning; feels like she is peeing but she isn't; it's the d/c; does have some very small white clumps; adv can use monistat 3d or 7d and if no better to call to be seen. Aware ABC not in office but will send msg to her. Pharm is correct in chart.

## 2019-10-08 NOTE — Telephone Encounter (Signed)
Pls check with pt to see how she is doing. If monistat didn't work, then pt needs appt this wk. Thx

## 2019-10-11 NOTE — Telephone Encounter (Signed)
Pt used monistat and says she noticed she's okay. She will give it more time and will make an appt if she needs to.

## 2020-01-10 NOTE — Patient Instructions (Addendum)
I value your feedback and entrusting us with your care. If you get a  patient survey, I would appreciate you taking the time to let us know about your experience today. Thank you!  As of March 17, 2019, your lab results will be released to your MyChart immediately, before I even have a chance to see them. Please give me time to review them and contact you if there are any abnormalities. Thank you for your patience.   [facility]: [phone]  [facility]: [phone]  

## 2020-01-10 NOTE — Progress Notes (Signed)
Chief Complaint  Patient presents with  . Gynecologic Exam  . Injections    flu shot  . [employer] Employee     HPI:      [name] is a 39 y.o. G2P1011 who LMP was Patient's last menstrual period was 12/21/2019 (approximate)., presents today for her annual examination. Her menses are monthly, lasting 7 days. Dysmenorrhea none. She does not have intermenstrual bleeding.   Sex activity: single partner, contraception - OCP (estrogen/progesterone).  Last Pap: 07/14/16  Results were: no abnormalities /neg HPV DNA  Hx of STDs: none Has occas fishy odor with d/c that resolves within a day or so. No sx today.  Last mammo: never There is no FH of breast cancer. There is no FH of ovarian cancer. The patient does not do self-breast exams.  Tobacco use: The patient denies current or previous tobacco use. Alcohol use: social drinker No drug use. Exercise: min active  She does get adequate calcium but not Vitamin D in her diet.  Normal labs at biometric screening at work.   Past Medical History:  Diagnosis Date  . Esophagitis   . Gastritis   . Hematuria 01/2019   on dipstick, neg microscopy with urology; reassurance.   Toni Amend    alpha thalassemia per labs; seeing [facility] genetic counselor    Past Surgical History:  Procedure Laterality Date  . CESAREAN SECTION N/A 12/08/2014   Procedure: PRIMARY CESAREAN SECTION;  Surgeon: Conard Novak, MD;  Location: [facility] ORS;  Service: Obstetrics;  Laterality: N/A;  . WISDOM TOOTH EXTRACTION      Family History  Problem Relation Age of Onset  . Kidney failure Father   . Heart Problems Father   . Hypertension Father   . Hyperlipidemia Father   . Stroke Sister 35  . Asthma Brother   . Breast cancer Neg Hx   . Ovarian cancer Neg Hx     Social History   Socioeconomic History  . Marital status: Married    Spouse name: Not on file  . Number of children: Not on file  . Years of education: Not on file  . Highest  education level: Not on file  Occupational History  . Not on file  Tobacco Use  . Smoking status: Never Smoker  . Smokeless tobacco: Never Used  Vaping Use  . Vaping Use: Never used  Substance and Sexual Activity  . Alcohol use: No  . Drug use: No  . Sexual activity: Yes    Birth control/protection: Pill  Other Topics Concern  . Not on file  Social History Narrative  . Not on file   Social Determinants of Health   Financial Resource Strain:   . Difficulty of Paying Living Expenses: Not on file  Food Insecurity:   . Worried About Programme researcher, broadcasting/film/video in the Last Year: Not on file  . Ran Out of Food in the Last Year: Not on file  Transportation Needs:   . Lack of Transportation (Medical): Not on file  . Lack of Transportation (Non-Medical): Not on file  Physical Activity:   . Days of Exercise per Week: Not on file  . Minutes of Exercise per Session: Not on file  Stress:   . Feeling of Stress : Not on file  Social Connections:   . Frequency of Communication with Friends and Family: Not on file  . Frequency of Social Gatherings with Friends and Family: Not on file  . Attends Religious Services: Not  on file  . Active Member of Clubs or Organizations: Not on file  . Attends Banker Meetings: Not on file  . Marital Status: Not on file  Intimate Partner Violence:   . Fear of Current or Ex-Partner: Not on file  . Emotionally Abused: Not on file  . Physically Abused: Not on file  . Sexually Abused: Not on file    Current Outpatient Medications on File Prior to Visit  Medication Sig Dispense Refill  . pantoprazole (PROTONIX) 40 MG tablet Take by mouth.    . ferrous sulfate 325 (65 FE) MG tablet Take by mouth. (Patient not taking: Reported on 01/11/2020)     No current facility-administered medications on file prior to visit.    ROS:  Review of Systems  Constitutional: Negative for fatigue, fever and unexpected weight change.  Respiratory: Negative for cough,  shortness of breath and wheezing.   Cardiovascular: Negative for chest pain, palpitations and leg swelling.  Gastrointestinal: Negative for blood in stool, constipation, diarrhea, nausea and vomiting.  Endocrine: Negative for cold intolerance, heat intolerance and polyuria.  Genitourinary: Negative for dyspareunia, dysuria, flank pain, frequency, genital sores, hematuria, menstrual problem, pelvic pain, urgency, vaginal bleeding, vaginal discharge and vaginal pain.  Musculoskeletal: Negative for back pain, joint swelling and myalgias.  Skin: Negative for rash.  Neurological: Negative for dizziness, syncope, light-headedness, numbness and headaches.  Hematological: Negative for adenopathy.  Psychiatric/Behavioral: Negative for agitation, confusion, sleep disturbance and suicidal ideas. The patient is not nervous/anxious.     Objective: BP 100/60   Ht 4' 10.5" (1.486 m)   Wt 171 lb (77.6 kg)   LMP 12/21/2019 (Approximate)   BMI 35.13 kg/m    Physical Exam Constitutional:      Appearance: She is well-developed.  Genitourinary:     Vulva, vagina, uterus, right adnexa and left adnexa normal.     No vulval lesion or tenderness noted.     No vaginal discharge, erythema or tenderness.     No cervical motion tenderness or polyp.     Uterus is not enlarged or tender.     No right or left adnexal mass present.     Right adnexa not tender.     Left adnexa not tender.  Neck:     Thyroid: No thyromegaly.  Cardiovascular:     Rate and Rhythm: Normal rate and regular rhythm.     Heart sounds: Normal heart sounds. No murmur heard.   Pulmonary:     Effort: Pulmonary effort is normal.     Breath sounds: Normal breath sounds.  Chest:     Breasts:        Right: No mass, nipple discharge, skin change or tenderness.        Left: No mass, nipple discharge, skin change or tenderness.  Abdominal:     Palpations: Abdomen is soft.     Tenderness: There is no abdominal tenderness. There is no  guarding.  Musculoskeletal:        General: Normal range of motion.     Cervical back: Normal range of motion.  Neurological:     General: No focal deficit present.     Mental Status: She is alert and oriented to person, place, and time.     Cranial Nerves: No cranial nerve deficit.  Skin:    General: Skin is warm and dry.  Psychiatric:        Mood and Affect: Mood normal.        Behavior: Behavior  normal.        Thought Content: Thought content normal.        Judgment: Judgment normal.  Vitals reviewed.    Assessment/Plan: Encounter for annual routine gynecological examination  Cervical cancer screening - Plan: IGP, Aptima HPV  Screening for HPV (human papillomavirus) - Plan: IGP, Aptima HPV  Encounter for surveillance of contraceptive pills - Plan: norethindrone-ethinyl estradiol (MICROGESTIN) 1-20 MG-MCG tablet; OCP RF  Encounter for screening mammogram for malignant neoplasm of breast - Plan: MM 3D SCREEN BREAST BILATERAL; pt to sched mammo  Need for immunization against influenza - Plan: Flu Vaccine QUAD 36+ mos IM    Meds ordered this encounter  Medications  . norethindrone-ethinyl estradiol (MICROGESTIN) 1-20 MG-MCG tablet    Sig: Take 1 tablet by mouth daily. CONTINUOUS DOSING    Dispense:  84 tablet    Refill:  4    Order Specific Question:   Supervising Provider    Answer:   Nadara Mustard [[id number]]             GYN counsel adequate intake of calcium and vitamin D, diet and exercise     F/U  Return in about 1 year (around 01/10/2021).  Cassadi Purdie B. Oluwatosin Higginson, PA-C 01/11/2020 2:35 PM

## 2020-01-11 ENCOUNTER — Encounter: Payer: Self-pay | Admitting: Obstetrics and Gynecology

## 2020-01-11 ENCOUNTER — Other Ambulatory Visit: Payer: Self-pay

## 2020-01-11 ENCOUNTER — Ambulatory Visit (INDEPENDENT_AMBULATORY_CARE_PROVIDER_SITE_OTHER): Payer: Managed Care, Other (non HMO) | Admitting: Obstetrics and Gynecology

## 2020-01-11 VITALS — BP 100/60 | Ht 58.5 in | Wt 171.0 lb

## 2020-01-11 DIAGNOSIS — Z01419 Encounter for gynecological examination (general) (routine) without abnormal findings: Secondary | ICD-10-CM | POA: Diagnosis not present

## 2020-01-11 DIAGNOSIS — Z1151 Encounter for screening for human papillomavirus (HPV): Secondary | ICD-10-CM | POA: Diagnosis not present

## 2020-01-11 DIAGNOSIS — Z3041 Encounter for surveillance of contraceptive pills: Secondary | ICD-10-CM

## 2020-01-11 DIAGNOSIS — Z1231 Encounter for screening mammogram for malignant neoplasm of breast: Secondary | ICD-10-CM

## 2020-01-11 DIAGNOSIS — Z23 Encounter for immunization: Secondary | ICD-10-CM

## 2020-01-11 DIAGNOSIS — Z124 Encounter for screening for malignant neoplasm of cervix: Secondary | ICD-10-CM

## 2020-01-11 MED ORDER — NORETHINDRONE ACET-ETHINYL EST 1-20 MG-MCG PO TABS: 1.0000 | ORAL_TABLET | Freq: Every day | ORAL | 4 refills | Status: DC

## 2020-01-16 LAB — IGP, APTIMA HPV: HPV Aptima: NEGATIVE

## 2020-04-04 ENCOUNTER — Other Ambulatory Visit: Payer: Self-pay | Admitting: Obstetrics and Gynecology

## 2020-04-04 DIAGNOSIS — Z3041 Encounter for surveillance of contraceptive pills: Secondary | ICD-10-CM

## 2020-05-15 ENCOUNTER — Telehealth: Payer: Self-pay

## 2020-05-15 ENCOUNTER — Other Ambulatory Visit: Payer: Self-pay

## 2020-05-15 ENCOUNTER — Ambulatory Visit (INDEPENDENT_AMBULATORY_CARE_PROVIDER_SITE_OTHER): Payer: Managed Care, Other (non HMO) | Admitting: Obstetrics and Gynecology

## 2020-05-15 ENCOUNTER — Encounter: Payer: Self-pay | Admitting: Obstetrics and Gynecology

## 2020-05-15 VITALS — BP 110/80 | Ht <= 58 in | Wt 178.0 lb

## 2020-05-15 DIAGNOSIS — B3731 Acute candidiasis of vulva and vagina: Secondary | ICD-10-CM

## 2020-05-15 DIAGNOSIS — N3001 Acute cystitis with hematuria: Secondary | ICD-10-CM

## 2020-05-15 DIAGNOSIS — B373 Candidiasis of vulva and vagina: Secondary | ICD-10-CM

## 2020-05-15 LAB — POCT URINALYSIS DIPSTICK
Bilirubin, UA: NEGATIVE
Blood, UA: NEGATIVE
Glucose, UA: NEGATIVE
Ketones, UA: NEGATIVE
Nitrite, UA: NEGATIVE
Protein, UA: NEGATIVE
Spec Grav, UA: 1.015 (ref 1.010–1.025)
pH, UA: 5 (ref 5.0–8.0)

## 2020-05-15 LAB — POCT WET PREP WITH KOH
Clue Cells Wet Prep HPF POC: NEGATIVE
KOH Prep POC: NEGATIVE
Trichomonas, UA: NEGATIVE
Yeast Wet Prep HPF POC: POSITIVE

## 2020-05-15 MED ORDER — NITROFURANTOIN MONOHYD MACRO 100 MG PO CAPS: 100.0000 mg | ORAL_CAPSULE | Freq: Two times a day (BID) | ORAL | 0 refills | Status: AC

## 2020-05-15 MED ORDER — FLUCONAZOLE 150 MG PO TABS: 150.0000 mg | ORAL_TABLET | Freq: Once | ORAL | 0 refills | Status: AC

## 2020-05-15 NOTE — Telephone Encounter (Signed)
Patient reports she was peeing blood last Monday. It went away. Inquiring if she needs to be seen? QP#[phone]

## 2020-05-15 NOTE — Progress Notes (Signed)
Pcp, No   Chief Complaint  Patient presents with  . Urinary Tract Infection    Pressure, blood in urine x since Sunday  . Vaginal Rash    No excessive discharge, itchiness or irritation    HPI:      [name] is a 40 y.o. G2P1011 whose LMP was Patient's last menstrual period was 05/08/2020 (approximate)., presents today for urinary frequency for 1 day, 3 days ago, with pelvic pressure and good flow, with hematuria. No sx since. No vag bleeding. No dysuria, LBP, fevers. Hx of UTIs in past. Also with 1 episode green d/c with vaginal stinging/irritation, occas odor. No recent abx use. No hx of yeast vag in past. Wears unscented pantyliners but changes regularly.    Past Medical History:  Diagnosis Date  . Esophagitis   . Gastritis   . Hematuria 01/2019   on dipstick, neg microscopy with urology; reassurance.   Toni Amend    alpha thalassemia per labs; seeing [facility] genetic counselor    Past Surgical History:  Procedure Laterality Date  . CESAREAN SECTION N/A 12/08/2014   Procedure: PRIMARY CESAREAN SECTION;  Surgeon: Conard Novak, MD;  Location: [facility] ORS;  Service: Obstetrics;  Laterality: N/A;  . WISDOM TOOTH EXTRACTION      Family History  Problem Relation Age of Onset  . Kidney failure Father   . Heart Problems Father   . Hypertension Father   . Hyperlipidemia Father   . Stroke Sister 70  . Asthma Brother   . Breast cancer Neg Hx   . Ovarian cancer Neg Hx     Social History   Socioeconomic History  . Marital status: Married    Spouse name: Not on file  . Number of children: Not on file  . Years of education: Not on file  . Highest education level: Not on file  Occupational History  . Not on file  Tobacco Use  . Smoking status: Never Smoker  . Smokeless tobacco: Never Used  Vaping Use  . Vaping Use: Never used  Substance and Sexual Activity  . Alcohol use: No  . Drug use: No  . Sexual activity: Yes    Birth control/protection: Pill   Other Topics Concern  . Not on file  Social History Narrative  . Not on file   Social Determinants of Health   Financial Resource Strain: Not on file  Food Insecurity: Not on file  Transportation Needs: Not on file  Physical Activity: Not on file  Stress: Not on file  Social Connections: Not on file  Intimate Partner Violence: Not on file    Outpatient Medications Prior to Visit  Medication Sig Dispense Refill  . Cholecalciferol (VITAMIN D3 PO) Take by mouth.    . norethindrone-ethinyl estradiol (MICROGESTIN) 1-20 MG-MCG tablet Take 1 tablet by mouth daily. CONTINUOUS DOSING 84 tablet 4  . pantoprazole (PROTONIX) 40 MG tablet Take by mouth.    . ferrous sulfate 325 (65 FE) MG tablet Take by mouth. (Patient not taking: Reported on 01/11/2020)     No facility-administered medications prior to visit.      ROS:  Review of Systems  Constitutional: Negative for fever, malaise/fatigue and weight loss.  Gastrointestinal: Negative for blood in stool, constipation, diarrhea, nausea and vomiting.  Genitourinary: Positive for dysuria, frequency, vaginal discharge and vaginal pain. Negative for dyspareunia, flank pain, hematuria, urgency and vaginal bleeding.  Musculoskeletal: Negative for back pain.  Skin: Negative for itching and rash.  OBJECTIVE:   Vitals:  BP 110/80   Ht 4\' 10"  (1.473 m)   Wt 178 lb (80.7 kg)   LMP 05/08/2020 (Approximate)   BMI 37.20 kg/m   Physical Exam Vitals reviewed.  Constitutional:      Appearance: She is well-developed.  Pulmonary:     Effort: Pulmonary effort is normal.  Genitourinary:    Pubic Area: No rash.      Labia:        Right: Rash present. No tenderness or lesion.        Left: Rash present. No tenderness or lesion.      Vagina: Vaginal discharge present. No erythema or tenderness.     Cervix: Normal.     Uterus: Normal. Not enlarged and not tender.      Adnexa: Right adnexa normal and left adnexa normal.       Right: No mass  or tenderness.         Left: No mass or tenderness.       Comments: BILAT LABIA MINORA WITH ERYTHEMA Musculoskeletal:        General: Normal range of motion.     Cervical back: Normal range of motion.  Skin:    General: Skin is warm and dry.  Neurological:     General: No focal deficit present.     Mental Status: She is alert and oriented to person, place, and time.  Psychiatric:        Mood and Affect: Mood normal.        Behavior: Behavior normal.        Thought Content: Thought content normal.        Judgment: Judgment normal.     Results: Results for orders placed or performed in visit on 05/15/20 (from the past 24 hour(s))  POCT Urinalysis Dipstick     Status: Abnormal   Collection Time: 05/15/20  4:47 PM  Result Value Ref Range   Color, UA yellow    Clarity, UA clear    Glucose, UA Negative Negative   Bilirubin, UA neg    Ketones, UA neg    Spec Grav, UA 1.015 1.010 - 1.025   Blood, UA neg    pH, UA 5.0 5.0 - 8.0   Protein, UA Negative Negative   Urobilinogen, UA     Nitrite, UA neg    Leukocytes, UA Small (1+) (A) Negative   Appearance     Odor    POCT Wet Prep with KOH     Status: Normal   Collection Time: 05/15/20  4:48 PM  Result Value Ref Range   Trichomonas, UA Negative    Clue Cells Wet Prep HPF POC neg    Epithelial Wet Prep HPF POC     Yeast Wet Prep HPF POC pos    Bacteria Wet Prep HPF POC     RBC Wet Prep HPF POC     WBC Wet Prep HPF POC     KOH Prep POC Negative Negative     Assessment/Plan: Acute cystitis with hematuria - Plan: nitrofurantoin, macrocrystal-monohydrate, (MACROBID) 100 MG capsule, POCT Urinalysis Dipstick, Urine Culture; pos sx and UA. Rx macrobid. Check C&S. F/u prn.   Candidal vaginitis - Plan: fluconazole (DIFLUCAN) 150 MG tablet, POCT Wet Prep with KOH; pos sx and wet prep. Rx diflucan. F/u prn.    Meds ordered this encounter  Medications  . fluconazole (DIFLUCAN) 150 MG tablet    Sig: Take 1 tablet (150 mg total) by  mouth  once for 1 dose.    Dispense:  1 tablet    Refill:  0    Order Specific Question:   Supervising Provider    Answer:   Nadara Mustard [id number]  . nitrofurantoin, macrocrystal-monohydrate, (MACROBID) 100 MG capsule    Sig: Take 1 capsule (100 mg total) by mouth 2 (two) times daily for 5 days.    Dispense:  10 capsule    Refill:  0    Order Specific Question:   Supervising Provider    Answer:   Nadara Mustard [[id number]]      Return if symptoms worsen or fail to improve.  Dajae Kizer B. Khyler Urda, PA-C 05/15/2020 4:49 PM

## 2020-05-15 NOTE — Telephone Encounter (Signed)
Spoke w/patient. She started period on Tuesday, but this feels like UTI. Having pressure/urge to pee and discomfort walking around. Scheduled apt for eval today w/[facility] at 2:50

## 2020-05-15 NOTE — Patient Instructions (Signed)
I value your feedback and you entrusting us with your care. If you get a [facility] patient survey, I would appreciate you taking the time to let us know about your experience today. Thank you! ? ? ?

## 2020-05-17 LAB — URINE CULTURE

## 2020-06-11 ENCOUNTER — Ambulatory Visit
Admission: RE | Admit: 2020-06-11 | Discharge: 2020-06-11 | Disposition: A | Discharge status: Home / Self Care | Payer: Managed Care, Other (non HMO) | Source: Ambulatory Visit | Attending: Obstetrics and Gynecology | Admitting: Obstetrics and Gynecology

## 2020-06-11 ENCOUNTER — Other Ambulatory Visit: Payer: Self-pay

## 2020-06-11 DIAGNOSIS — Z1231 Encounter for screening mammogram for malignant neoplasm of breast: Secondary | ICD-10-CM | POA: Insufficient documentation

## 2020-09-18 ENCOUNTER — Telehealth: Payer: Self-pay

## 2020-09-18 NOTE — Telephone Encounter (Signed)
This encounter was created in error - please disregard.

## 2020-09-18 NOTE — Telephone Encounter (Signed)
Pt calling; peeing blood; wants to see [facility] today.  [phone]  Pt states she also has pressure down there while voiding; adv [facility] not in the week; she can come by to leave a CCU or go to an urgent care.  Pt states she will see if her PCP can see her.  Adv with her peeing blood I would feel better if she would see a provider.  If PCP can't see her to go to an urgent care.

## 2021-03-18 ENCOUNTER — Other Ambulatory Visit: Payer: Self-pay

## 2021-03-18 ENCOUNTER — Ambulatory Visit (INDEPENDENT_AMBULATORY_CARE_PROVIDER_SITE_OTHER): Payer: Managed Care, Other (non HMO) | Admitting: Obstetrics and Gynecology

## 2021-03-18 ENCOUNTER — Encounter: Payer: Self-pay | Admitting: Obstetrics and Gynecology

## 2021-03-18 VITALS — BP 110/80 | Ht 58.5 in | Wt 172.0 lb

## 2021-03-18 DIAGNOSIS — Z01419 Encounter for gynecological examination (general) (routine) without abnormal findings: Secondary | ICD-10-CM | POA: Diagnosis not present

## 2021-03-18 DIAGNOSIS — Z3041 Encounter for surveillance of contraceptive pills: Secondary | ICD-10-CM

## 2021-03-18 DIAGNOSIS — Z1231 Encounter for screening mammogram for malignant neoplasm of breast: Secondary | ICD-10-CM

## 2021-03-18 MED ORDER — NORETHINDRONE ACET-ETHINYL EST 1-20 MG-MCG PO TABS: 1.0000 | ORAL_TABLET | Freq: Every day | ORAL | 4 refills | Status: DC

## 2021-03-18 NOTE — Progress Notes (Signed)
Chief Complaint  Patient presents with   Gynecologic Exam    No concerns     HPI:      [name] is a 40 y.o. G2P1011 who LMP was Patient's last menstrual period was 02/18/2021 (exact date)., presents today for her annual examination. Her menses are monthly, lasting 7 days. Dysmenorrhea none. She does not have intermenstrual bleeding.    Sex activity: single partner, contraception - OCP (estrogen/progesterone).  Last Pap: 01/11/20  Results were: no abnormalities /neg HPV DNA  Hx of STDs: none Has occas fishy odor with d/c that resolves within a day or so. No sx today. Was found to have crystals in urine with hematuria. Sx resolved. Saw PCP. FH kidney stones. Told to increase water intake.    Last mammo: 06/11/20 Results were normal, repeat in 12 months.  There is no FH of breast cancer. There is no FH of ovarian cancer. The patient does not do self-breast exams.   Tobacco use: The patient denies current or previous tobacco use. Alcohol use: social drinker No drug use. Exercise: min active   She does get adequate calcium and Vitamin D in her diet.  Normal labs at biometric screening at work.   Past Medical History:  Diagnosis Date   Esophagitis    Gastritis    Hematuria 01/2019   on dipstick, neg microscopy with urology; reassurance.    Thalassanemia    alpha thalassemia per labs; seeing [facility] genetic counselor    Past Surgical History:  Procedure Laterality Date   CESAREAN SECTION N/A 12/08/2014   Procedure: PRIMARY CESAREAN SECTION;  Surgeon: Will Bonnet, MD;  Location: [facility] ORS;  Service: Obstetrics;  Laterality: N/A;   WISDOM TOOTH EXTRACTION      Family History  Problem Relation Age of Onset   Kidney failure Father    Heart Problems Father    Hypertension Father    Hyperlipidemia Father    Stroke Sister 60   Asthma Brother    Breast cancer Neg Hx    Ovarian cancer Neg Hx     Social History   Socioeconomic History   Marital status: Married     Spouse name: Not on file   Number of children: Not on file   Years of education: Not on file   Highest education level: Not on file  Occupational History   Not on file  Tobacco Use   Smoking status: Never   Smokeless tobacco: Never  Vaping Use   Vaping Use: Never used  Substance and Sexual Activity   Alcohol use: No   Drug use: No   Sexual activity: Yes    Birth control/protection: Pill  Other Topics Concern   Not on file  Social History Narrative   Not on file   Social Determinants of Health   Financial Resource Strain: Not on file  Food Insecurity: Not on file  Transportation Needs: Not on file  Physical Activity: Not on file  Stress: Not on file  Social Connections: Not on file  Intimate Partner Violence: Not on file    Current Outpatient Medications on File Prior to Visit  Medication Sig Dispense Refill   Cholecalciferol (VITAMIN D3 PO) Take by mouth.     pantoprazole (PROTONIX) 40 MG tablet Take by mouth.     No current facility-administered medications on file prior to visit.    ROS:  Review of Systems  Constitutional:  Negative for fatigue, fever and unexpected weight change.  Respiratory:  Negative for cough, shortness of breath and wheezing.   Cardiovascular:  Negative for chest pain, palpitations and leg swelling.  Gastrointestinal:  Negative for blood in stool, constipation, diarrhea, nausea and vomiting.  Endocrine: Negative for cold intolerance, heat intolerance and polyuria.  Genitourinary:  Negative for dyspareunia, dysuria, flank pain, frequency, genital sores, hematuria, menstrual problem, pelvic pain, urgency, vaginal bleeding, vaginal discharge and vaginal pain.  Musculoskeletal:  Negative for back pain, joint swelling and myalgias.  Skin:  Negative for rash.  Neurological:  Negative for dizziness, syncope, light-headedness, numbness and headaches.  Hematological:  Negative for adenopathy.  Psychiatric/Behavioral:  Negative for agitation,  confusion, sleep disturbance and suicidal ideas. The patient is not nervous/anxious.    Objective: BP 110/80   Ht 4' 10.5" (1.486 m)   Wt 172 lb (78 kg)   LMP 02/18/2021 (Exact Date)   BMI 35.34 kg/m    Physical Exam Constitutional:      Appearance: She is well-developed.  Genitourinary:     Vulva normal.     Right Labia: No rash, tenderness or lesions.    Left Labia: No tenderness, lesions or rash.    No vaginal discharge, erythema or tenderness.      Right Adnexa: not tender and no mass present.    Left Adnexa: not tender and no mass present.    No cervical motion tenderness, friability or polyp.     Uterus is not enlarged or tender.  Breasts:    Right: No mass, nipple discharge, skin change or tenderness.     Left: No mass, nipple discharge, skin change or tenderness.  Neck:     Thyroid: No thyromegaly.  Cardiovascular:     Rate and Rhythm: Normal rate and regular rhythm.     Heart sounds: Normal heart sounds. No murmur heard. Pulmonary:     Effort: Pulmonary effort is normal.     Breath sounds: Normal breath sounds.  Abdominal:     Palpations: Abdomen is soft.     Tenderness: There is no abdominal tenderness. There is no guarding or rebound.  Musculoskeletal:        General: Normal range of motion.     Cervical back: Normal range of motion.  Lymphadenopathy:     Cervical: No cervical adenopathy.  Neurological:     General: No focal deficit present.     Mental Status: She is alert and oriented to person, place, and time.     Cranial Nerves: No cranial nerve deficit.  Skin:    General: Skin is warm and dry.  Psychiatric:        Mood and Affect: Mood normal.        Behavior: Behavior normal.        Thought Content: Thought content normal.        Judgment: Judgment normal.  Vitals reviewed.   Assessment/Plan: Encounter for annual routine gynecological examination  Encounter for surveillance of contraceptive pills - Plan: norethindrone-ethinyl estradiol  (MICROGESTIN) 1-20 MG-MCG tablet; OCP RF  Encounter for screening mammogram for malignant neoplasm of breast - Plan: MM 3D SCREEN BREAST BILATERAL; pt to sched mammo  Meds ordered this encounter  Medications   norethindrone-ethinyl estradiol (MICROGESTIN) 1-20 MG-MCG tablet    Sig: Take 1 tablet by mouth daily. CONTINUOUS DOSING    Dispense:  84 tablet    Refill:  4    Order Specific Question:   Supervising Provider    Answer:   Nadara Mustard [[id number]]  GYN counsel adequate intake of calcium and vitamin D, diet and exercise     F/U  Return in about 1 year (around 03/18/2022).  Alecxis Baltzell B. Hailie Searight, PA-C 03/18/2021 10:33 AM

## 2021-03-18 NOTE — Patient Instructions (Signed)
I value your feedback and you entrusting us with your care. If you get a [facility] patient survey, I would appreciate you taking the time to let us know about your experience today. Thank you!  [facility]: [phone]      

## 2021-06-17 ENCOUNTER — Other Ambulatory Visit: Payer: Self-pay

## 2021-06-17 ENCOUNTER — Ambulatory Visit
Admission: RE | Admit: 2021-06-17 | Discharge: 2021-06-17 | Disposition: A | Discharge status: Home / Self Care | Payer: Managed Care, Other (non HMO) | Source: Ambulatory Visit | Attending: Obstetrics and Gynecology | Admitting: Obstetrics and Gynecology

## 2021-06-17 DIAGNOSIS — Z1231 Encounter for screening mammogram for malignant neoplasm of breast: Secondary | ICD-10-CM | POA: Insufficient documentation

## 2021-10-01 ENCOUNTER — Other Ambulatory Visit: Payer: Self-pay | Admitting: Internal Medicine

## 2021-10-01 DIAGNOSIS — N390 Urinary tract infection, site not specified: Secondary | ICD-10-CM

## 2022-03-21 ENCOUNTER — Other Ambulatory Visit: Payer: Self-pay | Admitting: Obstetrics and Gynecology

## 2022-03-21 DIAGNOSIS — Z3041 Encounter for surveillance of contraceptive pills: Secondary | ICD-10-CM

## 2022-05-16 ENCOUNTER — Encounter: Payer: Self-pay | Admitting: Internal Medicine

## 2022-05-16 ENCOUNTER — Other Ambulatory Visit: Payer: Self-pay

## 2022-05-16 ENCOUNTER — Ambulatory Visit (INDEPENDENT_AMBULATORY_CARE_PROVIDER_SITE_OTHER): Payer: Managed Care, Other (non HMO) | Admitting: Internal Medicine

## 2022-05-16 VITALS — BP 110/72 | HR 84 | Ht <= 58 in | Wt 172.6 lb

## 2022-05-16 DIAGNOSIS — K219 Gastro-esophageal reflux disease without esophagitis: Secondary | ICD-10-CM | POA: Diagnosis not present

## 2022-05-16 DIAGNOSIS — M549 Dorsalgia, unspecified: Secondary | ICD-10-CM

## 2022-05-16 DIAGNOSIS — N3001 Acute cystitis with hematuria: Secondary | ICD-10-CM | POA: Diagnosis not present

## 2022-05-16 LAB — POCT URINALYSIS DIPSTICK
Bilirubin, UA: NEGATIVE
Blood, UA: 10
Glucose, UA: NEGATIVE
Ketones, UA: NEGATIVE
Leukocytes, UA: NEGATIVE
Nitrite, UA: NEGATIVE
Protein, UA: NEGATIVE
Spec Grav, UA: <=1.005 — AB (ref 1.010–1.025)
Urobilinogen, UA: 0.2 E.U./dL
pH, UA: 6 (ref 5.0–8.0)

## 2022-05-16 MED ORDER — CIPROFLOXACIN HCL 500 MG PO TABS: 500.0000 mg | ORAL_TABLET | Freq: Two times a day (BID) | ORAL | 0 refills | Status: AC

## 2022-05-16 NOTE — Progress Notes (Signed)
Established Patient Office Visit  Subjective:  Patient ID: Emily Bailey, female    DOB: 09/08/1980  Age: 42 y.o. MRN: [id number]  Chief Complaint  Patient presents with   Follow-up    Back pain left side, blood in urine    Patient comes in for her follow-up and also presents with noticing blood while urinating.  Started last night after she went to the bathroom she felt like she had trouble emptying her bladder and felt some fluid dripping, and it was when she wiped herself noticed blood on it.  Since then she went to urinate a few times and every time she would see bright red blood.  She denies any back pain or flank pain no fevers or chills no nausea vomiting but she does feel some pressure in the bladder area. Patient has had history of blood in her urine in the past and she was scheduled for a renal ultrasound last year which she admits that she missed that appointment.  Will set it up again and then will also send a prescription for p.o. Cipro.     Past Medical History:  Diagnosis Date   Esophagitis    Gastritis    Hematuria 01/2019   on dipstick, neg microscopy with urology; reassurance.    Thalassanemia    alpha thalassemia per labs; seeing [facility] genetic counselor    Social History   Socioeconomic History   Marital status: Married    Spouse name: Not on file   Number of children: Not on file   Years of education: Not on file   Highest education level: Not on file  Occupational History   Not on file  Tobacco Use   Smoking status: Never   Smokeless tobacco: Never  Vaping Use   Vaping Use: Never used  Substance and Sexual Activity   Alcohol use: No   Drug use: No   Sexual activity: Yes    Birth control/protection: Pill  Other Topics Concern   Not on file  Social History Narrative   Not on file   Social Determinants of Health   Financial Resource Strain: Not on file  Food Insecurity: Not on file  Transportation Needs: Not on file  Physical Activity: Not  on file  Stress: Not on file  Social Connections: Not on file  Intimate Partner Violence: Not on file    Family History  Problem Relation Age of Onset   Kidney failure Father    Heart Problems Father    Hypertension Father    Hyperlipidemia Father    Stroke Sister 89   Asthma Brother    Breast cancer Neg Hx    Ovarian cancer Neg Hx     No Known Allergies  Review of Systems  Constitutional:  Negative for chills, fever, malaise/fatigue and weight loss.  HENT:  Negative for ear discharge, ear pain, hearing loss and tinnitus.   Eyes: Negative.   Cardiovascular:  Positive for claudication. Negative for chest pain and palpitations.  Gastrointestinal:  Negative for abdominal pain, constipation, diarrhea, heartburn, nausea and vomiting.  Genitourinary:  Positive for dysuria and hematuria. Negative for flank pain, frequency and urgency.  Musculoskeletal: Negative.   Skin: Negative.   Neurological: Negative.   Psychiatric/Behavioral: Negative.         Objective:   BP 110/72   Pulse 84   Ht 4' 10"$  (1.473 m)   Wt 172 lb 9.6 oz (78.3 kg)   SpO2 99%   BMI 36.07 kg/m  Vitals:   05/16/22 1504  BP: 110/72  Pulse: 84  Height: 4' 10"$  (1.473 m)  Weight: 172 lb 9.6 oz (78.3 kg)  SpO2: 99%  BMI (Calculated): 36.08    Physical Exam Vitals and nursing note reviewed.  Constitutional:      Appearance: Normal appearance. She is obese.  HENT:     Head: Normocephalic and atraumatic.  Cardiovascular:     Rate and Rhythm: Normal rate and regular rhythm.  Pulmonary:     Effort: Pulmonary effort is normal.     Breath sounds: Normal breath sounds.  Abdominal:     General: Abdomen is flat. Bowel sounds are normal. There is no distension.     Palpations: Abdomen is soft. There is no mass.     Tenderness: There is no abdominal tenderness. There is no right CVA tenderness, left CVA tenderness or guarding.  Musculoskeletal:        General: Normal range of motion.     Cervical back:  Normal range of motion.  Skin:    General: Skin is warm and dry.  Neurological:     Mental Status: She is alert.  Psychiatric:        Mood and Affect: Mood normal.        Behavior: Behavior normal.      Results for orders placed or performed in visit on 05/16/22  POCT Urinalysis Dipstick (81002)  Result Value Ref Range   Color, UA     Clarity, UA     Glucose, UA Negative Negative   Bilirubin, UA negative    Ketones, UA negative    Spec Grav, UA <=1.005 (A) 1.010 - 1.025   Blood, UA 10    pH, UA 6.0 5.0 - 8.0   Protein, UA Negative Negative   Urobilinogen, UA 0.2 0.2 or 1.0 E.U./dL   Nitrite, UA negative    Leukocytes, UA Negative Negative   Appearance     Odor      Recent Results (from the past 2160 hour(s))  POCT Urinalysis Dipstick [id number])     Status: Abnormal   Collection Time: 05/16/22  3:06 PM  Result Value Ref Range   Color, UA     Clarity, UA     Glucose, UA Negative Negative   Bilirubin, UA negative    Ketones, UA negative    Spec Grav, UA <=1.005 (A) 1.010 - 1.025   Blood, UA 10     Comment: 10   pH, UA 6.0 5.0 - 8.0   Protein, UA Negative Negative   Urobilinogen, UA 0.2 0.2 or 1.0 E.U./dL   Nitrite, UA negative    Leukocytes, UA Negative Negative   Appearance     Odor        Assessment & Plan:   Problem List Items Addressed This Visit     Mid back pain on left side - Primary   Relevant Orders   POCT Urinalysis Dipstick [id number]) (Completed)   Hematuria due to acute cystitis   Relevant Medications   ciprofloxacin (CIPRO) 500 MG tablet   Other Relevant Orders   US Renal   Gastroesophageal reflux disease without esophagitis    @VISPATINSTR$ @  Return in about 2 weeks (around 05/30/2022).   Total time spent: 30 minutes  Perrin Maltese, MD  05/16/2022

## 2022-05-26 ENCOUNTER — Ambulatory Visit
Admission: RE | Admit: 2022-05-26 | Discharge: 2022-05-26 | Disposition: A | Discharge status: Home / Self Care | Payer: Managed Care, Other (non HMO) | Source: Ambulatory Visit | Attending: Internal Medicine | Admitting: Internal Medicine

## 2022-05-26 DIAGNOSIS — N3001 Acute cystitis with hematuria: Secondary | ICD-10-CM | POA: Diagnosis present

## 2022-06-02 ENCOUNTER — Encounter: Payer: Self-pay | Admitting: Internal Medicine

## 2022-06-02 ENCOUNTER — Ambulatory Visit (INDEPENDENT_AMBULATORY_CARE_PROVIDER_SITE_OTHER): Payer: Managed Care, Other (non HMO) | Admitting: Internal Medicine

## 2022-06-02 VITALS — BP 120/68 | HR 95 | Ht <= 58 in | Wt 173.0 lb

## 2022-06-02 DIAGNOSIS — N3001 Acute cystitis with hematuria: Secondary | ICD-10-CM | POA: Diagnosis not present

## 2022-06-02 DIAGNOSIS — K219 Gastro-esophageal reflux disease without esophagitis: Secondary | ICD-10-CM | POA: Diagnosis not present

## 2022-06-02 NOTE — Progress Notes (Signed)
Established Patient Office Visit  Subjective:  Patient ID: Emily Bailey, female    DOB: [birth year]  Age: 42 y.o. MRN: [id number]  Chief Complaint  Patient presents with   Follow-up    2 week follow up    Patient comes in for her 10-day follow-up for hematuria.  She has completed her p.o. Cipro and is feeling much better today.  She did not notice any more blood in her urine. She had a renal bladder ultrasound which was completely normal. Since patient is feeling much better today and has not seen any more blood in her urine she wants to  defer a urology consult.  Patient was advised to drink a lot of water and if she ever sees blood in her urine again to call us we will set up a urology consult.     Past Medical History:  Diagnosis Date   Esophagitis    Gastritis    Hematuria 01/2019   on dipstick, neg microscopy with urology; reassurance.    Thalassanemia    alpha thalassemia per labs; seeing [facility] genetic counselor    Social History   Socioeconomic History   Marital status: Married    Spouse name: Not on file   Number of children: Not on file   Years of education: Not on file   Highest education level: Not on file  Occupational History   Not on file  Tobacco Use   Smoking status: Never   Smokeless tobacco: Never  Vaping Use   Vaping Use: Never used  Substance and Sexual Activity   Alcohol use: No   Drug use: No   Sexual activity: Yes    Birth control/protection: Pill  Other Topics Concern   Not on file  Social History Narrative   Not on file   Social Determinants of Health   Financial Resource Strain: Not on file  Food Insecurity: Not on file  Transportation Needs: Not on file  Physical Activity: Not on file  Stress: Not on file  Social Connections: Not on file  Intimate Partner Violence: Not on file    Family History  Problem Relation Age of Onset   Kidney failure Father    Heart Problems Father    Hypertension Father    Hyperlipidemia Father     Stroke Sister 30   Asthma Brother    Breast cancer Neg Hx    Ovarian cancer Neg Hx     No Known Allergies  Review of Systems  HENT: Negative.    Respiratory: Negative.    Cardiovascular: Negative.   Gastrointestinal: Negative.   Genitourinary: Negative.   Musculoskeletal: Negative.   Skin: Negative.   Neurological: Negative.   Endo/Heme/Allergies: Negative.   Psychiatric/Behavioral: Negative.         Objective:   BP 120/68   Pulse 95   Ht '4\' 10"'$  (1.473 m)   Wt 173 lb (78.5 kg)   SpO2 99%   BMI 36.16 kg/m   Vitals:   06/02/22 0939  BP: 120/68  Pulse: 95  Height: '4\' 10"'$  (1.473 m)  Weight: 173 lb (78.5 kg)  SpO2: 99%  BMI (Calculated): 36.17    Physical Exam Vitals and nursing note reviewed.  Constitutional:      Appearance: Normal appearance. She is obese.  HENT:     Head: Normocephalic.  Cardiovascular:     Rate and Rhythm: Normal rate and regular rhythm.     Pulses: Normal pulses.     Heart sounds: Normal heart  sounds.  Pulmonary:     Effort: Pulmonary effort is normal.     Breath sounds: Normal breath sounds.  Abdominal:     General: Abdomen is flat. Bowel sounds are normal.     Palpations: Abdomen is soft.  Musculoskeletal:        General: Normal range of motion.  Neurological:     General: No focal deficit present.     Mental Status: She is alert and oriented to person, place, and time.      No results found for any visits on 06/02/22.  Recent Results (from the past 2160 hour(s))  POCT Urinalysis Dipstick [id number])     Status: Abnormal   Collection Time: 05/16/22  3:06 PM  Result Value Ref Range   Color, UA     Clarity, UA     Glucose, UA Negative Negative   Bilirubin, UA negative    Ketones, UA negative    Spec Grav, UA <=1.005 (A) 1.010 - 1.025   Blood, UA 10     Comment: 10   pH, UA 6.0 5.0 - 8.0   Protein, UA Negative Negative   Urobilinogen, UA 0.2 0.2 or 1.0 E.U./dL   Nitrite, UA negative    Leukocytes, UA Negative  Negative   Appearance     Odor        Assessment & Plan:  Patient advised to continue her medications and to drink extra fluids. Problem List Items Addressed This Visit     Hematuria due to acute cystitis - Primary   Gastroesophageal reflux disease without esophagitis    Return in about 4 months (around 10/01/2022).   Total time spent: 20 minutes  Perrin Maltese, MD  06/02/2022

## 2022-07-21 ENCOUNTER — Other Ambulatory Visit: Payer: Self-pay | Admitting: Internal Medicine

## 2022-07-21 DIAGNOSIS — Z1231 Encounter for screening mammogram for malignant neoplasm of breast: Secondary | ICD-10-CM

## 2022-08-18 ENCOUNTER — Ambulatory Visit
Admission: RE | Admit: 2022-08-18 | Discharge: 2022-08-18 | Disposition: A | Discharge status: Home / Self Care | Payer: Managed Care, Other (non HMO) | Source: Ambulatory Visit | Attending: Internal Medicine | Admitting: Internal Medicine

## 2022-08-18 DIAGNOSIS — Z1231 Encounter for screening mammogram for malignant neoplasm of breast: Secondary | ICD-10-CM

## 2022-09-18 ENCOUNTER — Encounter: Payer: Self-pay | Admitting: Internal Medicine

## 2022-09-18 ENCOUNTER — Ambulatory Visit: Payer: Managed Care, Other (non HMO) | Admitting: Internal Medicine

## 2022-09-18 VITALS — BP 112/76 | HR 71 | Ht 58.5 in | Wt 172.6 lb

## 2022-09-18 DIAGNOSIS — N3001 Acute cystitis with hematuria: Secondary | ICD-10-CM | POA: Diagnosis not present

## 2022-09-18 DIAGNOSIS — K219 Gastro-esophageal reflux disease without esophagitis: Secondary | ICD-10-CM | POA: Diagnosis not present

## 2022-09-18 DIAGNOSIS — R3915 Urgency of urination: Secondary | ICD-10-CM | POA: Diagnosis not present

## 2022-09-18 LAB — POCT URINALYSIS DIPSTICK
Bilirubin, UA: NEGATIVE
Glucose, UA: NEGATIVE
Nitrite, UA: NEGATIVE
Protein, UA: POSITIVE — AB
Spec Grav, UA: 1.025 (ref 1.010–1.025)
Urobilinogen, UA: 0.2 E.U./dL
pH, UA: 6 (ref 5.0–8.0)

## 2022-09-18 MED ORDER — CIPROFLOXACIN HCL 500 MG PO TABS
500.0000 mg | ORAL_TABLET | Freq: Two times a day (BID) | ORAL | 0 refills | Status: AC
Start: 2022-09-18 — End: 2022-09-25

## 2022-09-18 NOTE — Progress Notes (Signed)
Established Patient Office Visit  Subjective:  Patient ID: Emily Bailey, female    DOB: 18-Jul-1980  Age: 42 y.o. MRN: [id number]  Chief Complaint  Patient presents with   Acute Visit    Pressure while urinating     Patient comes in with 1 day history of urgency, dysuria, frequency and burning micturition.  She is also having some lower abdominal discomfort.  There is no flank pain.  Did not complain of any nausea or vomiting, and no fevers or chills. Urine dipstick shows pus cells, as well as blood but she is on her menstrual cycle.    No other concerns at this time.   Past Medical History:  Diagnosis Date   Esophagitis    Gastritis    Hematuria 01/2019   on dipstick, neg microscopy with urology; reassurance.    Thalassanemia    alpha thalassemia per labs; seeing [facility] genetic counselor    Past Surgical History:  Procedure Laterality Date   CESAREAN SECTION N/A 12/08/2014   Procedure: PRIMARY CESAREAN SECTION;  Surgeon: Conard Novak, MD;  Location: [facility] ORS;  Service: Obstetrics;  Laterality: N/A;   WISDOM TOOTH EXTRACTION      Social History   Socioeconomic History   Marital status: Married    Spouse name: Not on file   Number of children: Not on file   Years of education: Not on file   Highest education level: Not on file  Occupational History   Not on file  Tobacco Use   Smoking status: Never   Smokeless tobacco: Never  Vaping Use   Vaping Use: Never used  Substance and Sexual Activity   Alcohol use: No   Drug use: No   Sexual activity: Yes    Birth control/protection: Pill  Other Topics Concern   Not on file  Social History Narrative   Not on file   Social Determinants of Health   Financial Resource Strain: Not on file  Food Insecurity: Not on file  Transportation Needs: Not on file  Physical Activity: Not on file  Stress: Not on file  Social Connections: Not on file  Intimate Partner Violence: Not on file    Family History  Problem  Relation Age of Onset   Kidney failure Father    Heart Problems Father    Hypertension Father    Hyperlipidemia Father    Stroke Sister 64   Asthma Brother    Breast cancer Neg Hx    Ovarian cancer Neg Hx     No Known Allergies  Review of Systems  Constitutional:  Positive for malaise/fatigue. Negative for chills, diaphoresis, fever and weight loss.  HENT:  Negative for congestion, ear discharge, ear pain, hearing loss, nosebleeds, sinus pain, sore throat and tinnitus.   Eyes: Negative.   Respiratory:  Negative for cough, hemoptysis, sputum production, shortness of breath and stridor.   Cardiovascular:  Negative for chest pain, palpitations and leg swelling.  Gastrointestinal:  Negative for abdominal pain, blood in stool, constipation, diarrhea, heartburn, nausea and vomiting.  Genitourinary:  Positive for dysuria, frequency and urgency. Negative for flank pain.  Musculoskeletal:  Negative for back pain, falls, joint pain and myalgias.  Neurological: Negative.   Psychiatric/Behavioral: Negative.         Objective:   BP 112/76   Pulse 71   Ht 4' 10.5" (1.486 m)   Wt 172 lb 9.6 oz (78.3 kg)   SpO2 99%   BMI 35.46 kg/m   Vitals:  09/18/22 1428  BP: 112/76  Pulse: 71  Height: 4' 10.5" (1.486 m)  Weight: 172 lb 9.6 oz (78.3 kg)  SpO2: 99%  BMI (Calculated): 35.45    Physical Exam Vitals and nursing note reviewed.  Constitutional:      Appearance: Normal appearance.  HENT:     Head: Normocephalic and atraumatic.  Cardiovascular:     Rate and Rhythm: Normal rate and regular rhythm.     Pulses: Normal pulses.  Pulmonary:     Effort: Pulmonary effort is normal.     Breath sounds: Normal breath sounds. No wheezing, rhonchi or rales.  Abdominal:     General: Bowel sounds are normal.     Palpations: Abdomen is soft.     Tenderness: There is no right CVA tenderness or left CVA tenderness.  Musculoskeletal:        General: No tenderness. Normal range of motion.      Cervical back: Normal range of motion and neck supple.     Right lower leg: No edema.     Left lower leg: No edema.  Skin:    General: Skin is warm and dry.  Neurological:     General: No focal deficit present.     Mental Status: She is alert and oriented to person, place, and time.  Psychiatric:        Mood and Affect: Mood normal.      Results for orders placed or performed in visit on 09/18/22  POCT Urinalysis Dipstick (81002)  Result Value Ref Range   Color, UA yellow    Clarity, UA clear    Glucose, UA Negative Negative   Bilirubin, UA neg    Ketones, UA trace    Spec Grav, UA 1.025 1.010 - 1.025   Blood, UA large    pH, UA 6.0 5.0 - 8.0   Protein, UA Positive (A) Negative   Urobilinogen, UA 0.2 0.2 or 1.0 E.U./dL   Nitrite, UA neg    Leukocytes, UA Small (1+) (A) Negative   Appearance clear    Odor yes     Recent Results (from the past 2160 hour(s))  POCT Urinalysis Dipstick ([id number])     Status: Abnormal   Collection Time: 09/18/22  2:35 PM  Result Value Ref Range   Color, UA yellow    Clarity, UA clear    Glucose, UA Negative Negative   Bilirubin, UA neg    Ketones, UA trace    Spec Grav, UA 1.025 1.010 - 1.025   Blood, UA large    pH, UA 6.0 5.0 - 8.0   Protein, UA Positive (A) Negative   Urobilinogen, UA 0.2 0.2 or 1.0 E.U./dL   Nitrite, UA neg    Leukocytes, UA Small (1+) (A) Negative   Appearance clear    Odor yes       Assessment & Plan:  Patient will start p.o. Cipro empirically.  Encouraged to drink lots of water.  Will return in 10 days for a repeat u/a. Problem List Items Addressed This Visit     Acute cystitis with hematuria   Relevant Medications   ciprofloxacin (CIPRO) 500 MG tablet   Gastroesophageal reflux disease without esophagitis   Urinary urgency - Primary   Relevant Orders   POCT Urinalysis Dipstick ([id number]) (Completed)    Return in about 10 days (around 09/28/2022).   Total time spent: 25 minutes  Margaretann Loveless,  MD  09/18/2022   This document may have been prepared by [employer] Voice  Recognition software and as such may include unintentional dictation errors.

## 2022-09-29 ENCOUNTER — Encounter: Payer: Self-pay | Admitting: Internal Medicine

## 2022-09-29 ENCOUNTER — Ambulatory Visit (INDEPENDENT_AMBULATORY_CARE_PROVIDER_SITE_OTHER): Payer: Managed Care, Other (non HMO) | Admitting: Internal Medicine

## 2022-09-29 VITALS — BP 115/70 | HR 86 | Ht 58.5 in | Wt 171.8 lb

## 2022-09-29 DIAGNOSIS — K219 Gastro-esophageal reflux disease without esophagitis: Secondary | ICD-10-CM

## 2022-09-29 DIAGNOSIS — R102 Pelvic and perineal pain: Secondary | ICD-10-CM | POA: Diagnosis not present

## 2022-09-29 DIAGNOSIS — R309 Painful micturition, unspecified: Secondary | ICD-10-CM

## 2022-09-29 LAB — POCT URINALYSIS DIPSTICK
Bilirubin, UA: NEGATIVE
Glucose, UA: NEGATIVE
Ketones, UA: NEGATIVE
Leukocytes, UA: NEGATIVE
Nitrite, UA: NEGATIVE
Protein, UA: NEGATIVE
Spec Grav, UA: 1.02 (ref 1.010–1.025)
Urobilinogen, UA: 0.2 E.U./dL
pH, UA: 7 (ref 5.0–8.0)

## 2022-09-29 NOTE — Progress Notes (Signed)
Established Patient Office Visit  Subjective:  Patient ID: Emily Bailey, female    DOB: Aug 27, 1980  Age: 42 y.o. MRN: [id number]  Chief Complaint  Patient presents with   Follow-up    10 day follow up    Patient in office for 10 day follow up from UTI. Completed antibiotics and urine dipstick has cleared up. However continues to have pelvic pain/pressure. Has h/x of pelvic pain- will contact her ob/gyn. No further symptoms of urgency, dysuria, frequency and burning micturition.     No other concerns at this time.   Past Medical History:  Diagnosis Date   Esophagitis    Gastritis    Hematuria 01/2019   on dipstick, neg microscopy with urology; reassurance.    Thalassanemia    alpha thalassemia per labs; seeing [facility] genetic counselor    Past Surgical History:  Procedure Laterality Date   CESAREAN SECTION N/A 12/08/2014   Procedure: PRIMARY CESAREAN SECTION;  Surgeon: Conard Novak, MD;  Location: [facility] ORS;  Service: Obstetrics;  Laterality: N/A;   WISDOM TOOTH EXTRACTION      Social History   Socioeconomic History   Marital status: Married    Spouse name: Not on file   Number of children: Not on file   Years of education: Not on file   Highest education level: Not on file  Occupational History   Not on file  Tobacco Use   Smoking status: Never   Smokeless tobacco: Never  Vaping Use   Vaping Use: Never used  Substance and Sexual Activity   Alcohol use: No   Drug use: No   Sexual activity: Yes    Birth control/protection: Pill  Other Topics Concern   Not on file  Social History Narrative   Not on file   Social Determinants of Health   Financial Resource Strain: Not on file  Food Insecurity: Not on file  Transportation Needs: Not on file  Physical Activity: Not on file  Stress: Not on file  Social Connections: Not on file  Intimate Partner Violence: Not on file    Family History  Problem Relation Age of Onset   Kidney failure Father    Heart  Problems Father    Hypertension Father    Hyperlipidemia Father    Stroke Sister 100   Asthma Brother    Breast cancer Neg Hx    Ovarian cancer Neg Hx     No Known Allergies  Review of Systems  Constitutional: Negative.  Negative for chills, fever, malaise/fatigue and weight loss.  HENT: Negative.    Eyes: Negative.   Respiratory:  Negative for cough, shortness of breath and wheezing.   Cardiovascular: Negative.  Negative for chest pain and palpitations.  Gastrointestinal: Negative.  Negative for abdominal pain, constipation, diarrhea, heartburn, nausea and vomiting.  Genitourinary: Negative.  Negative for dysuria, flank pain, hematuria and urgency.  Musculoskeletal: Negative.  Negative for falls, joint pain and myalgias.  Skin: Negative.   Neurological: Negative.  Negative for dizziness and headaches.  Endo/Heme/Allergies: Negative.   Psychiatric/Behavioral: Negative.         Objective:   BP 115/70   Pulse 86   Ht 4' 10.5" (1.486 m)   Wt 171 lb 12.8 oz (77.9 kg)   SpO2 99%   BMI 35.30 kg/m   Vitals:   09/29/22 0943  BP: 115/70  Pulse: 86  Height: 4' 10.5" (1.486 m)  Weight: 171 lb 12.8 oz (77.9 kg)  SpO2: 99%  BMI (Calculated):  35.29    Physical Exam Vitals and nursing note reviewed.  Constitutional:      Appearance: Normal appearance.  HENT:     Head: Normocephalic and atraumatic.     Nose: Nose normal.  Cardiovascular:     Rate and Rhythm: Normal rate and regular rhythm.     Heart sounds: Normal heart sounds.  Pulmonary:     Effort: Pulmonary effort is normal.     Breath sounds: Normal breath sounds. No rales.  Abdominal:     General: Bowel sounds are normal.     Palpations: Abdomen is soft. There is no mass.     Tenderness: There is abdominal tenderness in the suprapubic area. There is no right CVA tenderness, left CVA tenderness or guarding.     Hernia: No hernia is present.  Musculoskeletal:        General: Normal range of motion.     Cervical  back: Normal range of motion.  Skin:    General: Skin is warm and dry.  Neurological:     General: No focal deficit present.     Mental Status: She is alert and oriented to person, place, and time.  Psychiatric:        Mood and Affect: Mood normal.        Behavior: Behavior normal.      Results for orders placed or performed in visit on 09/29/22  POCT Urinalysis Dipstick (81002)  Result Value Ref Range   Color, UA yellow    Clarity, UA clear    Glucose, UA Negative Negative   Bilirubin, UA negative    Ketones, UA negataive    Spec Grav, UA 1.020 1.010 - 1.025   Blood, UA trace-lysed    pH, UA 7.0 5.0 - 8.0   Protein, UA Negative Negative   Urobilinogen, UA 0.2 0.2 or 1.0 E.U./dL   Nitrite, UA negative    Leukocytes, UA Negative Negative   Appearance     Odor      Recent Results (from the past 2160 hour(s))  POCT Urinalysis Dipstick ([id number])     Status: Abnormal   Collection Time: 09/18/22  2:35 PM  Result Value Ref Range   Color, UA yellow    Clarity, UA clear    Glucose, UA Negative Negative   Bilirubin, UA neg    Ketones, UA trace    Spec Grav, UA 1.025 1.010 - 1.025   Blood, UA large    pH, UA 6.0 5.0 - 8.0   Protein, UA Positive (A) Negative   Urobilinogen, UA 0.2 0.2 or 1.0 E.U./dL   Nitrite, UA neg    Leukocytes, UA Small (1+) (A) Negative   Appearance clear    Odor yes   POCT Urinalysis Dipstick ([id number])     Status: Abnormal   Collection Time: 09/29/22  9:55 AM  Result Value Ref Range   Color, UA yellow    Clarity, UA clear    Glucose, UA Negative Negative   Bilirubin, UA negative    Ketones, UA negataive    Spec Grav, UA 1.020 1.010 - 1.025   Blood, UA trace-lysed    pH, UA 7.0 5.0 - 8.0   Protein, UA Negative Negative   Urobilinogen, UA 0.2 0.2 or 1.0 E.U./dL   Nitrite, UA negative    Leukocytes, UA Negative Negative   Appearance     Odor        Assessment & Plan:  Patient continues to have suprapubic pain/pressure. Will follow up  with  GYN. Will return in 6 weeks, if she continues to have suprapubic pain/pressure, will refer to urology.   Problem List Items Addressed This Visit     Acute cystitis with hematuria   Urinary pain - Primary   Relevant Orders   POCT Urinalysis Dipstick ([id number]) (Completed)    Return in about 6 weeks (around 11/10/2022).   Total time spent: 25 minutes  Margaretann Loveless, MD  09/29/2022   This document may have been prepared by [facility] Voice Recognition software and as such may include unintentional dictation errors.

## 2022-11-10 ENCOUNTER — Ambulatory Visit: Payer: Managed Care, Other (non HMO) | Admitting: Internal Medicine

## 2022-11-16 NOTE — Progress Notes (Unsigned)
No chief complaint on file.    HPI:      [name] is a 42 y.o. G2P1011 who LMP was No LMP recorded., presents today for her annual examination. Her menses are monthly, lasting 7 days. Dysmenorrhea none. She does not have intermenstrual bleeding.    Sex activity: single partner, contraception - OCP (estrogen/progesterone).  Last Pap: 01/11/20  Results were: no abnormalities /neg HPV DNA  Hx of STDs: none Has occas fishy odor with d/c that resolves within a day or so. No sx today. Was found to have crystals in urine with hematuria. Sx resolved. Saw PCP. FH kidney stones. Told to increase water intake.    Last mammo: 08/18/22 Results were normal, repeat in 12 months.  There is no FH of breast cancer. There is no FH of ovarian cancer. The patient does not do self-breast exams.   Tobacco use: The patient denies current or previous tobacco use. Alcohol use: social drinker No drug use. Exercise: min active   She does get adequate calcium and Vitamin D in her diet.  Normal labs at biometric screening at work.   Past Medical History:  Diagnosis Date   Esophagitis    Gastritis    Hematuria 01/2019   on dipstick, neg microscopy with urology; reassurance.    Thalassanemia    alpha thalassemia per labs; seeing [facility] genetic counselor    Past Surgical History:  Procedure Laterality Date   CESAREAN SECTION N/A 12/08/2014   Procedure: PRIMARY CESAREAN SECTION;  Surgeon: Conard Novak, MD;  Location: [facility] ORS;  Service: Obstetrics;  Laterality: N/A;   WISDOM TOOTH EXTRACTION      Family History  Problem Relation Age of Onset   Kidney failure Father    Heart Problems Father    Hypertension Father    Hyperlipidemia Father    Stroke Sister 45   Asthma Brother    Breast cancer Neg Hx    Ovarian cancer Neg Hx     Social History   Socioeconomic History   Marital status: Married    Spouse name: Not on file   Number of children: Not on file   Years of education: Not  on file   Highest education level: Not on file  Occupational History   Not on file  Tobacco Use   Smoking status: Never   Smokeless tobacco: Never  Vaping Use   Vaping status: Never Used  Substance and Sexual Activity   Alcohol use: No   Drug use: No   Sexual activity: Yes    Birth control/protection: Pill  Other Topics Concern   Not on file  Social History Narrative   Not on file   Social Determinants of Health   Financial Resource Strain: Not on file  Food Insecurity: Not on file  Transportation Needs: Not on file  Physical Activity: Not on file  Stress: Not on file  Social Connections: Not on file  Intimate Partner Violence: Not on file    Current Outpatient Medications on File Prior to Visit  Medication Sig Dispense Refill   Collagen-Vitamin C-Biotin (COLLAGEN 1500/C) 500-50-0.8 MG CAPS Take by mouth.     Multiple Vitamin (MULTIVITAMIN) tablet Take 1 tablet by mouth daily.     No current facility-administered medications on file prior to visit.    ROS:  Review of Systems  Constitutional:  Negative for fatigue, fever and unexpected weight change.  Respiratory:  Negative for cough, shortness of breath and wheezing.   Cardiovascular:  Negative for chest pain, palpitations and leg swelling.  Gastrointestinal:  Negative for blood in stool, constipation, diarrhea, nausea and vomiting.  Endocrine: Negative for cold intolerance, heat intolerance and polyuria.  Genitourinary:  Negative for dyspareunia, dysuria, flank pain, frequency, genital sores, hematuria, menstrual problem, pelvic pain, urgency, vaginal bleeding, vaginal discharge and vaginal pain.  Musculoskeletal:  Negative for back pain, joint swelling and myalgias.  Skin:  Negative for rash.  Neurological:  Negative for dizziness, syncope, light-headedness, numbness and headaches.  Hematological:  Negative for adenopathy.  Psychiatric/Behavioral:  Negative for agitation, confusion, sleep disturbance and suicidal  ideas. The patient is not nervous/anxious.     Objective: There were no vitals taken for this visit.   Physical Exam Constitutional:      Appearance: She is well-developed.  Genitourinary:     Vulva normal.     Right Labia: No rash, tenderness or lesions.    Left Labia: No tenderness, lesions or rash.    No vaginal discharge, erythema or tenderness.      Right Adnexa: not tender and no mass present.    Left Adnexa: not tender and no mass present.    No cervical motion tenderness, friability or polyp.     Uterus is not enlarged or tender.  Breasts:    Right: No mass, nipple discharge, skin change or tenderness.     Left: No mass, nipple discharge, skin change or tenderness.  Neck:     Thyroid: No thyromegaly.  Cardiovascular:     Rate and Rhythm: Normal rate and regular rhythm.     Heart sounds: Normal heart sounds. No murmur heard. Pulmonary:     Effort: Pulmonary effort is normal.     Breath sounds: Normal breath sounds.  Abdominal:     Palpations: Abdomen is soft.     Tenderness: There is no abdominal tenderness. There is no guarding or rebound.  Musculoskeletal:        General: Normal range of motion.     Cervical back: Normal range of motion.  Lymphadenopathy:     Cervical: No cervical adenopathy.  Neurological:     General: No focal deficit present.     Mental Status: She is alert and oriented to person, place, and time.     Cranial Nerves: No cranial nerve deficit.  Skin:    General: Skin is warm and dry.  Psychiatric:        Mood and Affect: Mood normal.        Behavior: Behavior normal.        Thought Content: Thought content normal.        Judgment: Judgment normal.  Vitals reviewed.    Assessment/Plan: Encounter for annual routine gynecological examination  Encounter for surveillance of contraceptive pills - Plan: norethindrone-ethinyl estradiol (MICROGESTIN) 1-20 MG-MCG tablet; OCP RF  Encounter for screening mammogram for malignant neoplasm of  breast - Plan: MM 3D SCREEN BREAST BILATERAL; pt to sched mammo  No orders of the defined types were placed in this encounter.            GYN counsel adequate intake of calcium and vitamin D, diet and exercise     F/U  No follow-ups on file.  Kalin Kyler B. Lezlie Ritchey, PA-C 11/16/2022 9:07 PM

## 2022-11-17 ENCOUNTER — Encounter: Payer: Self-pay | Admitting: Obstetrics and Gynecology

## 2022-11-17 ENCOUNTER — Ambulatory Visit (INDEPENDENT_AMBULATORY_CARE_PROVIDER_SITE_OTHER): Payer: Managed Care, Other (non HMO) | Admitting: Obstetrics and Gynecology

## 2022-11-17 VITALS — BP 115/74 | HR 68 | Ht <= 58 in | Wt 173.0 lb

## 2022-11-17 DIAGNOSIS — Z3041 Encounter for surveillance of contraceptive pills: Secondary | ICD-10-CM

## 2022-11-17 DIAGNOSIS — Z01419 Encounter for gynecological examination (general) (routine) without abnormal findings: Secondary | ICD-10-CM

## 2022-11-17 DIAGNOSIS — Z1231 Encounter for screening mammogram for malignant neoplasm of breast: Secondary | ICD-10-CM

## 2022-11-17 DIAGNOSIS — Z30011 Encounter for initial prescription of contraceptive pills: Secondary | ICD-10-CM

## 2022-11-17 MED ORDER — LEVONORGESTREL-ETHINYL ESTRAD 0.1-20 MG-MCG PO TABS
1.0000 | ORAL_TABLET | Freq: Every day | ORAL | 3 refills | Status: AC
Start: 2022-11-17 — End: ?

## 2022-11-17 NOTE — Patient Instructions (Signed)
I value your feedback and you entrusting us with your care. If you get a [facility] patient survey, I would appreciate you taking the time to let us know about your experience today. Thank you! ? ? ?

## 2022-11-24 ENCOUNTER — Ambulatory Visit (INDEPENDENT_AMBULATORY_CARE_PROVIDER_SITE_OTHER): Payer: Managed Care, Other (non HMO) | Admitting: Internal Medicine

## 2022-11-24 ENCOUNTER — Encounter: Payer: Self-pay | Admitting: Internal Medicine

## 2022-11-24 VITALS — BP 136/72 | HR 67 | Ht <= 58 in | Wt 173.8 lb

## 2022-11-24 DIAGNOSIS — M79671 Pain in right foot: Secondary | ICD-10-CM | POA: Diagnosis not present

## 2022-11-24 DIAGNOSIS — K219 Gastro-esophageal reflux disease without esophagitis: Secondary | ICD-10-CM

## 2022-11-24 NOTE — Progress Notes (Signed)
Established Patient Office Visit  Subjective:  Patient ID: Emily Bailey, female    DOB: 24-Jan-1981  Age: 42 y.o. MRN: [id number]  Chief Complaint  Patient presents with   Follow-up    Patient comes in for her follow-up.  She is no longer having pelvic pain or dysuria.  Evaluated by her OB/GYN and no reason for pelvic pain was identified.  She is going to start a new birth control pill with her next menstrual cycle. Today however complains of heel pain especially in the right foot.  She has a  relatively low arch, will set up with podiatry consult for orthotics. Heartburn is under good control these days. Patient understands the need to lose weight, will try to control her diet and increase physical activity once her heel pain has improved.    No other concerns at this time.   Past Medical History:  Diagnosis Date   Esophagitis    Gastritis    Hematuria 01/2019   on dipstick, neg microscopy with urology; reassurance.    Thalassanemia    alpha thalassemia per labs; seeing [facility] genetic counselor    Past Surgical History:  Procedure Laterality Date   CESAREAN SECTION N/A 12/08/2014   Procedure: PRIMARY CESAREAN SECTION;  Surgeon: Conard Novak, MD;  Location: [facility] ORS;  Service: Obstetrics;  Laterality: N/A;   WISDOM TOOTH EXTRACTION      Social History   Socioeconomic History   Marital status: Married    Spouse name: Not on file   Number of children: Not on file   Years of education: Not on file   Highest education level: Not on file  Occupational History   Not on file  Tobacco Use   Smoking status: Never   Smokeless tobacco: Never  Vaping Use   Vaping status: Never Used  Substance and Sexual Activity   Alcohol use: No   Drug use: No   Sexual activity: Yes    Birth control/protection: Pill  Other Topics Concern   Not on file  Social History Narrative   Not on file   Social Determinants of Health   Financial Resource Strain: Not on file  Food  Insecurity: Not on file  Transportation Needs: Not on file  Physical Activity: Not on file  Stress: Not on file  Social Connections: Not on file  Intimate Partner Violence: Not on file    Family History  Problem Relation Age of Onset   Kidney failure Father    Heart Problems Father    Hypertension Father    Hyperlipidemia Father    Stroke Sister 42   Asthma Brother    Breast cancer Neg Hx    Ovarian cancer Neg Hx     No Known Allergies  Review of Systems  Constitutional: Negative.  Negative for chills, fever, malaise/fatigue and weight loss.  HENT: Negative.  Negative for sinus pain and sore throat.   Eyes: Negative.   Respiratory: Negative.  Negative for cough and shortness of breath.   Cardiovascular: Negative.  Negative for chest pain, palpitations and leg swelling.  Gastrointestinal: Negative.  Negative for abdominal pain, constipation, diarrhea, heartburn, nausea and vomiting.  Genitourinary: Negative.  Negative for dysuria and flank pain.  Musculoskeletal:  Negative for back pain, falls, joint pain, myalgias and neck pain.  Skin: Negative.   Neurological: Negative.  Negative for dizziness and headaches.  Endo/Heme/Allergies: Negative.   Psychiatric/Behavioral: Negative.  Negative for depression and suicidal ideas. The patient is not nervous/anxious.  Objective:   BP 136/72   Pulse 67   Ht 4\' 10"  (1.473 m)   Wt 173 lb 12.8 oz (78.8 kg)   LMP 11/07/2022 (Approximate)   SpO2 99%   BMI 36.32 kg/m   Vitals:   11/24/22 1150  BP: 136/72  Pulse: 67  Height: 4\' 10"  (1.473 m)  Weight: 173 lb 12.8 oz (78.8 kg)  SpO2: 99%  BMI (Calculated): 36.33    Physical Exam Vitals and nursing note reviewed.  Constitutional:      Appearance: Normal appearance.  HENT:     Head: Normocephalic and atraumatic.     Nose: Nose normal.     Mouth/Throat:     Mouth: Mucous membranes are moist.     Pharynx: Oropharynx is clear.  Eyes:     Conjunctiva/sclera:  Conjunctivae normal.     Pupils: Pupils are equal, round, and reactive to light.  Cardiovascular:     Rate and Rhythm: Normal rate and regular rhythm.     Pulses: Normal pulses.     Heart sounds: Normal heart sounds. No murmur heard. Pulmonary:     Effort: Pulmonary effort is normal.     Breath sounds: Normal breath sounds. No wheezing.  Abdominal:     General: Bowel sounds are normal.     Palpations: Abdomen is soft.     Tenderness: There is no abdominal tenderness. There is no right CVA tenderness or left CVA tenderness.  Musculoskeletal:        General: Normal range of motion.     Cervical back: Normal range of motion.     Right lower leg: No edema.     Left lower leg: No edema.  Skin:    General: Skin is warm and dry.  Neurological:     General: No focal deficit present.     Mental Status: She is alert and oriented to person, place, and time.  Psychiatric:        Mood and Affect: Mood normal.        Behavior: Behavior normal.      No results found for any visits on 11/24/22.  Recent Results (from the past 2160 hour(s))  POCT Urinalysis Dipstick (40981)     Status: Abnormal   Collection Time: 09/18/22  2:35 PM  Result Value Ref Range   Color, UA yellow    Clarity, UA clear    Glucose, UA Negative Negative   Bilirubin, UA neg    Ketones, UA trace    Spec Grav, UA 1.025 1.010 - 1.025   Blood, UA large    pH, UA 6.0 5.0 - 8.0   Protein, UA Positive (A) Negative   Urobilinogen, UA 0.2 0.2 or 1.0 E.U./dL   Nitrite, UA neg    Leukocytes, UA Small (1+) (A) Negative   Appearance clear    Odor yes   POCT Urinalysis Dipstick ([id number])     Status: Abnormal   Collection Time: 09/29/22  9:55 AM  Result Value Ref Range   Color, UA yellow    Clarity, UA clear    Glucose, UA Negative Negative   Bilirubin, UA negative    Ketones, UA negataive    Spec Grav, UA 1.020 1.010 - 1.025   Blood, UA trace-lysed    pH, UA 7.0 5.0 - 8.0   Protein, UA Negative Negative    Urobilinogen, UA 0.2 0.2 or 1.0 E.U./dL   Nitrite, UA negative    Leukocytes, UA Negative Negative   Appearance  Odor        Assessment & Plan:  Continue current medications.   Podiatry consult for heel pain. Problem List Items Addressed This Visit     Gastroesophageal reflux disease without esophagitis   Pain of right heel - Primary   Relevant Orders   Ambulatory referral to Podiatry    Return in about 4 months (around 03/26/2023).   Total time spent: 25 minutes  Margaretann Loveless, MD  11/24/2022   This document may have been prepared by [facility] Voice Recognition software and as such may include unintentional dictation errors.

## 2023-02-24 ENCOUNTER — Encounter: Payer: Self-pay | Admitting: Podiatry

## 2023-02-24 ENCOUNTER — Ambulatory Visit: Payer: Managed Care, Other (non HMO) | Admitting: Podiatry

## 2023-02-24 ENCOUNTER — Ambulatory Visit (INDEPENDENT_AMBULATORY_CARE_PROVIDER_SITE_OTHER): Payer: Managed Care, Other (non HMO)

## 2023-02-24 VITALS — Ht <= 58 in | Wt 173.8 lb

## 2023-02-24 DIAGNOSIS — M722 Plantar fascial fibromatosis: Secondary | ICD-10-CM

## 2023-02-24 DIAGNOSIS — M79671 Pain in right foot: Secondary | ICD-10-CM

## 2023-02-24 NOTE — Progress Notes (Signed)
  Subjective:  Patient ID: Emily Bailey, female    DOB: 11-08-1980,  MRN: [id number]  Chief Complaint  Patient presents with   Foot Pain    PT is here due to right foot and heel pain states the pain started a few months ago with no injury to foot.    42 y.o. female presents with the above complaint.  Patient presents with right heel pain that has been on for quite some time is progressive gotten worse worse with ambulation is with pressure she has not seen anyone else prior to seeing me.  No new injuries has been on for few months.  Pain scale 7 out of   Review of Systems: Negative except as noted in the HPI. Denies N/V/F/Ch.  Past Medical History:  Diagnosis Date   Esophagitis    Gastritis    Hematuria 01/2019   on dipstick, neg microscopy with urology; reassurance.    Thalassanemia    alpha thalassemia per labs; seeing [facility] genetic counselor    Current Outpatient Medications:    cholecalciferol (VITAMIN D3) 25 MCG (1000 UNIT) tablet, Take 2,000 Units by mouth daily., Disp: , Rfl:    Collagen-Vitamin C-Biotin (COLLAGEN 1500/C) 500-50-0.8 MG CAPS, Take by mouth., Disp: , Rfl:    levonorgestrel-ethinyl estradiol (AVIANE) 0.1-20 MG-MCG tablet, Take 1 tablet by mouth daily., Disp: 84 tablet, Rfl: 3   Multiple Vitamin (MULTIVITAMIN) tablet, Take 1 tablet by mouth daily., Disp: , Rfl:   Social History   Tobacco Use  Smoking Status Never  Smokeless Tobacco Never    No Known Allergies Objective:  There were no vitals filed for this visit. Body mass index is 36.32 kg/m. Constitutional Well developed. Well nourished.  Vascular Dorsalis pedis pulses palpable bilaterally. Posterior tibial pulses palpable bilaterally. Capillary refill normal to all digits.  No cyanosis or clubbing noted. Pedal hair growth normal.  Neurologic Normal speech. Oriented to person, place, and time. Epicritic sensation to light touch grossly present bilaterally.  Dermatologic Nails well groomed and  normal in appearance. No open wounds. No skin lesions.  Orthopedic: Normal joint ROM without pain or crepitus bilaterally. No visible deformities. Tender to palpation at the calcaneal tuber right. No pain with calcaneal squeeze right. Ankle ROM diminished range of motion right. Silfverskiold Test: positive right.   Radiographs: Taken and reviewed. No acute fractures or dislocations. No evidence of stress fracture.  Plantar heel spur absent. Posterior heel spur present.  Pes planovalgus foot structure  Assessment:   1. Plantar fasciitis of right foot    Plan:  Patient was evaluated and treated and all questions answered.  Plantar Fasciitis, right - XR reviewed as above.  - Educated on icing and stretching. Instructions given.  - Injection delivered to the plantar fascia as below. - DME: Plantar fascial brace dispensed to support the medial longitudinal arch of the foot and offload pressure from the heel and prevent arch collapse during weightbearing - Pharmacologic management: None  Procedure: Injection Tendon/Ligament Location: Right plantar fascia at the glabrous junction; medial approach. Skin Prep: alcohol Injectate: 0.5 cc 0.5% marcaine plain, 0.5 cc of 1% Lidocaine, 0.5 cc kenalog 10. Disposition: Patient tolerated procedure well. Injection site dressed with a band-aid.  No follow-ups on file.

## 2023-03-03 ENCOUNTER — Encounter: Payer: Self-pay | Admitting: Podiatry

## 2023-03-24 ENCOUNTER — Ambulatory Visit (INDEPENDENT_AMBULATORY_CARE_PROVIDER_SITE_OTHER): Payer: Managed Care, Other (non HMO) | Admitting: Podiatry

## 2023-03-24 ENCOUNTER — Encounter: Payer: Self-pay | Admitting: Podiatry

## 2023-03-24 VITALS — Ht <= 58 in | Wt 173.8 lb

## 2023-03-24 DIAGNOSIS — M722 Plantar fascial fibromatosis: Secondary | ICD-10-CM

## 2023-03-24 DIAGNOSIS — Q666 Other congenital valgus deformities of feet: Secondary | ICD-10-CM

## 2023-03-24 NOTE — Progress Notes (Unsigned)
Subjective:  Patient ID: Emily Bailey, female    DOB: 05-Jul-1980,  MRN: [id number]  Chief Complaint  Patient presents with   Foot Pain    Pt here to f/u on right foot pain states foot is better than before but is still having some pain.    42 y.o. female presents with the above complaint.  Patient presents for follow-up of right plantar fasciitis.  She states she is doing better.  She still has some residual pain.  She is also here to discuss orthotics denies any other acute complaints   Review of Systems: Negative except as noted in the HPI. Denies N/V/F/Ch.  Past Medical History:  Diagnosis Date   Esophagitis    Gastritis    Hematuria 01/2019   on dipstick, neg microscopy with urology; reassurance.    Thalassanemia    alpha thalassemia per labs; seeing [facility] genetic counselor    Current Outpatient Medications:    cholecalciferol (VITAMIN D3) 25 MCG (1000 UNIT) tablet, Take 2,000 Units by mouth daily., Disp: , Rfl:    Collagen-Vitamin C-Biotin (COLLAGEN 1500/C) 500-50-0.8 MG CAPS, Take by mouth., Disp: , Rfl:    levonorgestrel-ethinyl estradiol (AVIANE) 0.1-20 MG-MCG tablet, Take 1 tablet by mouth daily., Disp: 84 tablet, Rfl: 3   Multiple Vitamin (MULTIVITAMIN) tablet, Take 1 tablet by mouth daily., Disp: , Rfl:   Social History   Tobacco Use  Smoking Status Never  Smokeless Tobacco Never    No Known Allergies Objective:  There were no vitals filed for this visit. Body mass index is 36.32 kg/m. Constitutional Well developed. Well nourished.  Vascular Dorsalis pedis pulses palpable bilaterally. Posterior tibial pulses palpable bilaterally. Capillary refill normal to all digits.  No cyanosis or clubbing noted. Pedal hair growth normal.  Neurologic Normal speech. Oriented to person, place, and time. Epicritic sensation to light touch grossly present bilaterally.  Dermatologic Nails well groomed and normal in appearance. No open wounds. No skin lesions.   Orthopedic: Normal joint ROM without pain or crepitus bilaterally. No visible deformities. Tender to palpation at the calcaneal tuber right. No pain with calcaneal squeeze right. Ankle ROM diminished range of motion right. Silfverskiold Test: positive right.   Radiographs: Taken and reviewed. No acute fractures or dislocations. No evidence of stress fracture.  Plantar heel spur absent. Posterior heel spur present.  Pes planovalgus foot structure  Assessment:   1. Plantar fasciitis of right foot   2. Pes planovalgus     Plan:  Patient was evaluated and treated and all questions answered.  Plantar Fasciitis, right - XR reviewed as above.  - Educated on icing and stretching. Instructions given.  - second Injection delivered to the plantar fascia as below. - DME: Plantar fascial brace dispensed to support the medial longitudinal arch of the foot and offload pressure from the heel and prevent arch collapse during weightbearing - Pharmacologic management: None  Pes planovalgus -I explained to patient the etiology of pes planovalgus and relationship with Planter fasciitis and various treatment options were discussed.  Given patient foot structure in the setting of Planter fasciitis I believe patient will benefit from custom-made orthotics to help control the hindfoot motion support the arch of the foot and take the stress away from plantar fascial.  Patient agrees with the plan like to proceed with orthotics -Patient was casted for orthotics   Procedure: Injection Tendon/Ligament Location: Right plantar fascia at the glabrous junction; medial approach. Skin Prep: alcohol Injectate: 0.5 cc 0.5% marcaine plain, 0.5 cc of 1%  Lidocaine, 0.5 cc kenalog 10. Disposition: Patient tolerated procedure well. Injection site dressed with a band-aid.  No follow-ups on file.

## 2023-04-15 ENCOUNTER — Ambulatory Visit: Payer: Managed Care, Other (non HMO) | Admitting: Family

## 2023-04-15 DIAGNOSIS — R3915 Urgency of urination: Secondary | ICD-10-CM

## 2023-04-15 LAB — POCT URINALYSIS DIPSTICK
Bilirubin, UA: NEGATIVE
Blood, UA: POSITIVE
Glucose, UA: NEGATIVE
Ketones, UA: POSITIVE
Leukocytes, UA: NEGATIVE
Nitrite, UA: NEGATIVE
Protein, UA: NEGATIVE
Spec Grav, UA: 1.02 (ref 1.010–1.025)
Urobilinogen, UA: 0.2 U/dL
pH, UA: 6 (ref 5.0–8.0)

## 2023-04-16 ENCOUNTER — Encounter: Payer: Self-pay | Admitting: Internal Medicine

## 2023-04-16 ENCOUNTER — Ambulatory Visit: Payer: Managed Care, Other (non HMO) | Admitting: Internal Medicine

## 2023-04-16 VITALS — BP 120/78 | HR 86 | Ht 58.5 in | Wt 177.4 lb

## 2023-04-16 DIAGNOSIS — K219 Gastro-esophageal reflux disease without esophagitis: Secondary | ICD-10-CM | POA: Diagnosis not present

## 2023-04-16 DIAGNOSIS — Z0130 Encounter for examination of blood pressure without abnormal findings: Secondary | ICD-10-CM

## 2023-04-16 DIAGNOSIS — N3000 Acute cystitis without hematuria: Secondary | ICD-10-CM

## 2023-04-16 DIAGNOSIS — R102 Pelvic and perineal pain: Secondary | ICD-10-CM

## 2023-04-16 LAB — POCT URINALYSIS DIPSTICK
Bilirubin, UA: NEGATIVE
Glucose, UA: NEGATIVE
Ketones, UA: POSITIVE
Nitrite, UA: NEGATIVE
Protein, UA: POSITIVE — AB
Spec Grav, UA: 1.025 (ref 1.010–1.025)
Urobilinogen, UA: 0.2 U/dL
pH, UA: 7 (ref 5.0–8.0)

## 2023-04-16 MED ORDER — AMOXICILLIN-POT CLAVULANATE 875-125 MG PO TABS: 1.0000 | ORAL_TABLET | Freq: Two times a day (BID) | ORAL | 0 refills | Status: DC

## 2023-04-16 NOTE — Progress Notes (Signed)
 Established Patient Office Visit  Subjective:  Patient ID: Emily Bailey, female    DOB: 11-Sep-1980  Age: 43 y.o. MRN: [id number]  Chief Complaint  Patient presents with   Acute Visit    Pelvic pressure    Patient comes in with reports of dysuria, pelvic pain and pressure.  She is about to start her menstrual cycle as well.  Urine dipstick shows small pus cells.  Patient had a negative renal bladder ultrasound done in February 2024.   No nausea vomiting, no flank pain, and no fevers and chills.  Will send in a prescription for Augmentin  and culture the urine.    No other concerns at this time.   Past Medical History:  Diagnosis Date   Esophagitis    Gastritis    Hematuria 01/2019   on dipstick, neg microscopy with urology; reassurance.    Thalassanemia    alpha thalassemia per labs; seeing [facility] genetic counselor    Past Surgical History:  Procedure Laterality Date   CESAREAN SECTION N/A 12/08/2014   Procedure: PRIMARY CESAREAN SECTION;  Surgeon: Garnette JONETTA Mace, MD;  Location: [facility] ORS;  Service: Obstetrics;  Laterality: N/A;   WISDOM TOOTH EXTRACTION      Social History   Socioeconomic History   Marital status: Married    Spouse name: Not on file   Number of children: Not on file   Years of education: Not on file   Highest education level: Not on file  Occupational History   Not on file  Tobacco Use   Smoking status: Never   Smokeless tobacco: Never  Vaping Use   Vaping status: Never Used  Substance and Sexual Activity   Alcohol use: No   Drug use: No   Sexual activity: Yes    Birth control/protection: Pill  Other Topics Concern   Not on file  Social History Narrative   Not on file   Social Drivers of Health   Financial Resource Strain: Not on file  Food Insecurity: Not on file  Transportation Needs: Not on file  Physical Activity: Not on file  Stress: Not on file  Social Connections: Not on file  Intimate Partner Violence: Not on file     Family History  Problem Relation Age of Onset   Kidney failure Father    Heart Problems Father    Hypertension Father    Hyperlipidemia Father    Stroke Sister 33   Asthma Brother    Breast cancer Neg Hx    Ovarian cancer Neg Hx     No Known Allergies  Outpatient Medications Prior to Visit  Medication Sig   cholecalciferol (VITAMIN D3) 25 MCG (1000 UNIT) tablet Take 2,000 Units by mouth daily. (Patient not taking: Reported on 04/16/2023)   Collagen-Vitamin C-Biotin (COLLAGEN 1500/C) 500-50-0.8 MG CAPS Take by mouth. (Patient not taking: Reported on 04/16/2023)   levonorgestrel -ethinyl estradiol (AVIANE) 0.1-20 MG-MCG tablet Take 1 tablet by mouth daily. (Patient not taking: Reported on 04/16/2023)   Multiple Vitamin (MULTIVITAMIN) tablet Take 1 tablet by mouth daily. (Patient not taking: Reported on 04/16/2023)   No facility-administered medications prior to visit.    Review of Systems  Constitutional:  Positive for malaise/fatigue. Negative for chills, fever and weight loss.  HENT: Negative.  Negative for ear discharge, ear pain and nosebleeds.   Eyes: Negative.   Respiratory: Negative.  Negative for cough and shortness of breath.   Cardiovascular: Negative.  Negative for chest pain, palpitations and leg swelling.  Gastrointestinal: Negative.  Negative for abdominal pain, constipation, diarrhea, heartburn, nausea and vomiting.  Genitourinary:  Positive for dysuria and urgency. Negative for flank pain.  Musculoskeletal: Negative.  Negative for joint pain and myalgias.  Skin: Negative.   Neurological: Negative.  Negative for dizziness and headaches.  Endo/Heme/Allergies: Negative.   Psychiatric/Behavioral: Negative.  Negative for depression and suicidal ideas. The patient is not nervous/anxious.        Objective:   BP 120/78   Pulse 86   Ht 4' 10.5 (1.486 m)   Wt 177 lb 6.4 oz (80.5 kg)   SpO2 99%   BMI 36.45 kg/m   Vitals:   04/16/23 1134  BP: 120/78  Pulse: 86   Height: 4' 10.5 (1.486 m)  Weight: 177 lb 6.4 oz (80.5 kg)  SpO2: 99%  BMI (Calculated): 36.44    Physical Exam Vitals and nursing note reviewed.  Constitutional:      Appearance: Normal appearance.  HENT:     Head: Normocephalic and atraumatic.     Nose: Nose normal.     Mouth/Throat:     Mouth: Mucous membranes are moist.     Pharynx: Oropharynx is clear.  Eyes:     Conjunctiva/sclera: Conjunctivae normal.     Pupils: Pupils are equal, round, and reactive to light.  Cardiovascular:     Rate and Rhythm: Normal rate and regular rhythm.     Pulses: Normal pulses.     Heart sounds: Normal heart sounds. No murmur heard. Pulmonary:     Effort: Pulmonary effort is normal.     Breath sounds: Normal breath sounds. No wheezing.  Abdominal:     General: Bowel sounds are normal.     Palpations: Abdomen is soft.     Tenderness: There is no abdominal tenderness. There is no right CVA tenderness or left CVA tenderness.  Musculoskeletal:        General: Normal range of motion.     Cervical back: Normal range of motion.     Right lower leg: No edema.     Left lower leg: No edema.  Skin:    General: Skin is warm and dry.  Neurological:     General: No focal deficit present.     Mental Status: She is alert and oriented to person, place, and time.  Psychiatric:        Mood and Affect: Mood normal.        Behavior: Behavior normal.      Results for orders placed or performed in visit on 04/16/23  POCT Urinalysis Dipstick ([id number])  Result Value Ref Range   Color, UA yellow    Clarity, UA clear    Glucose, UA Negative Negative   Bilirubin, UA neg    Ketones, UA pos    Spec Grav, UA 1.025 1.010 - 1.025   Blood, UA trace    pH, UA 7.0 5.0 - 8.0   Protein, UA Positive (A) Negative   Urobilinogen, UA 0.2 0.2 or 1.0 E.U./dL   Nitrite, UA neg    Leukocytes, UA Small (1+) (A) Negative   Appearance clear    Odor yes     Recent Results (from the past 2160 hours)  POCT  Urinalysis Dipstick ([id number])     Status: None   Collection Time: 04/15/23  1:00 PM  Result Value Ref Range   Color, UA yellow    Clarity, UA clear    Glucose, UA Negative Negative   Bilirubin, UA neg    Ketones, UA pos  Spec Grav, UA 1.020 1.010 - 1.025   Blood, UA pos    pH, UA 6.0 5.0 - 8.0   Protein, UA Negative Negative   Urobilinogen, UA 0.2 0.2 or 1.0 E.U./dL   Nitrite, UA neg    Leukocytes, UA Negative Negative   Appearance clear    Odor yes   POCT Urinalysis Dipstick ([id number])     Status: Abnormal   Collection Time: 04/16/23 11:44 AM  Result Value Ref Range   Color, UA yellow    Clarity, UA clear    Glucose, UA Negative Negative   Bilirubin, UA neg    Ketones, UA pos    Spec Grav, UA 1.025 1.010 - 1.025   Blood, UA trace    pH, UA 7.0 5.0 - 8.0   Protein, UA Positive (A) Negative   Urobilinogen, UA 0.2 0.2 or 1.0 E.U./dL   Nitrite, UA neg    Leukocytes, UA Small (1+) (A) Negative   Appearance clear    Odor yes       Assessment & Plan:  Start p.o. Augmentin .  Send urine for culture. Problem List Items Addressed This Visit     Gastroesophageal reflux disease without esophagitis   Other Visit Diagnoses       Acute cystitis without hematuria    -  Primary   Relevant Medications   amoxicillin -clavulanate (AUGMENTIN ) 875-125 MG tablet   Other Relevant Orders   Urinalysis   Urine Culture     Pelvic pressure in female       Relevant Orders   POCT Urinalysis Dipstick (18997) (Completed)       Return in about 10 days (around 04/26/2023).   Total time spent: 25 minutes  FERNAND FREDY RAMAN, MD  04/16/2023   This document may have been prepared by [facility] Voice Recognition software and as such may include unintentional dictation errors.

## 2023-04-16 NOTE — Progress Notes (Signed)
 Patient notified

## 2023-04-18 NOTE — Progress Notes (Signed)
   CHIEF COMPLAINT  UA/ only visit fot UTI     REASON FOR VISIT  Possible UTI, UA Visit Only      ASSESSMENT & PLAN Diagnoses and all orders for this visit:  Urinary urgency -     POCT Urinalysis Dipstick ([id number])     Patient notified.  Total time spent: 5 minutes  ALAN CHRISTELLA ARRANT, FNP 04/15/2023

## 2023-04-21 ENCOUNTER — Ambulatory Visit: Payer: Managed Care, Other (non HMO) | Admitting: Podiatry

## 2023-04-21 ENCOUNTER — Encounter: Payer: Self-pay | Admitting: Podiatry

## 2023-04-21 VITALS — Ht 58.5 in | Wt 177.4 lb

## 2023-04-21 DIAGNOSIS — M722 Plantar fascial fibromatosis: Secondary | ICD-10-CM

## 2023-04-21 DIAGNOSIS — Q666 Other congenital valgus deformities of feet: Secondary | ICD-10-CM

## 2023-04-21 NOTE — Progress Notes (Signed)
 Subjective:  Patient ID: Emily Bailey, female    DOB: 07/17/1980,  MRN: [id number]  Chief Complaint  Patient presents with   Foot Pain    Pt is here to f/u on right foot pain.    43 y.o. female presents with the above complaint.  Patient presents for follow-up of right plantar fasciitis.  She is doing a lot better.  She is managing the pain with bracing.  She is awaiting orthotics.   Review of Systems: Negative except as noted in the HPI. Denies N/V/F/Ch.  Past Medical History:  Diagnosis Date   Esophagitis    Gastritis    Hematuria 01/2019   on dipstick, neg microscopy with urology; reassurance.    Thalassanemia    alpha thalassemia per labs; seeing [facility] genetic counselor    Current Outpatient Medications:    amoxicillin -clavulanate (AUGMENTIN ) 875-125 MG tablet, Take 1 tablet by mouth 2 (two) times daily., Disp: 14 tablet, Rfl: 0   cholecalciferol (VITAMIN D3) 25 MCG (1000 UNIT) tablet, Take 2,000 Units by mouth daily., Disp: , Rfl:    Collagen-Vitamin C-Biotin (COLLAGEN 1500/C) 500-50-0.8 MG CAPS, Take by mouth., Disp: , Rfl:    levonorgestrel -ethinyl estradiol (AVIANE) 0.1-20 MG-MCG tablet, Take 1 tablet by mouth daily., Disp: 84 tablet, Rfl: 3   Multiple Vitamin (MULTIVITAMIN) tablet, Take 1 tablet by mouth daily., Disp: , Rfl:   Social History   Tobacco Use  Smoking Status Never  Smokeless Tobacco Never    No Known Allergies Objective:  There were no vitals filed for this visit. Body mass index is 36.45 kg/m. Constitutional Well developed. Well nourished.  Vascular Dorsalis pedis pulses palpable bilaterally. Posterior tibial pulses palpable bilaterally. Capillary refill normal to all digits.  No cyanosis or clubbing noted. Pedal hair growth normal.  Neurologic Normal speech. Oriented to person, place, and time. Epicritic sensation to light touch grossly present bilaterally.  Dermatologic Nails well groomed and normal in appearance. No open wounds. No  skin lesions.  Orthopedic: Normal joint ROM without pain or crepitus bilaterally. No visible deformities. Tender to palpation at the calcaneal tuber right. No pain with calcaneal squeeze right. Ankle ROM diminished range of motion right. Silfverskiold Test: positive right.   Radiographs: Taken and reviewed. No acute fractures or dislocations. No evidence of stress fracture.  Plantar heel spur absent. Posterior heel spur present.  Pes planovalgus foot structure  Assessment:   1. Plantar fasciitis of right foot   2. Pes planovalgus      Plan:  Patient was evaluated and treated and all questions answered.  Plantar Fasciitis, right - XR reviewed as above.  - Educated on icing and stretching. Instructions given.  - second Injection delivered to the plantar fascia as below. - DME: Plantar fascial brace dispensed to support the medial longitudinal arch of the foot and offload pressure from the heel and prevent arch collapse during weightbearing - Pharmacologic management: None  Pes planovalgus -I explained to patient the etiology of pes planovalgus and relationship with Planter fasciitis and various treatment options were discussed.  Given patient foot structure in the setting of Planter fasciitis I believe patient will benefit from custom-made orthotics to help control the hindfoot motion support the arch of the foot and take the stress away from plantar fascial.  Patient agrees with the plan like to proceed with orthotics -Patient is awaiting orthotics   Procedure: Injection Tendon/Ligament Location: Right plantar fascia at the glabrous junction; medial approach. Skin Prep: alcohol Injectate: 0.5 cc 0.5% marcaine  plain, 0.5  cc of 1% Lidocaine , 0.5 cc kenalog 10. Disposition: Patient tolerated procedure well. Injection site dressed with a band-aid.  No follow-ups on file.

## 2023-04-27 ENCOUNTER — Encounter: Payer: Self-pay | Admitting: Internal Medicine

## 2023-04-27 ENCOUNTER — Ambulatory Visit: Payer: Managed Care, Other (non HMO) | Admitting: Internal Medicine

## 2023-04-27 VITALS — BP 110/82 | HR 72 | Ht 58.5 in | Wt 176.6 lb

## 2023-04-27 DIAGNOSIS — Z0130 Encounter for examination of blood pressure without abnormal findings: Secondary | ICD-10-CM

## 2023-04-27 DIAGNOSIS — K219 Gastro-esophageal reflux disease without esophagitis: Secondary | ICD-10-CM

## 2023-04-27 DIAGNOSIS — R3915 Urgency of urination: Secondary | ICD-10-CM | POA: Diagnosis not present

## 2023-04-27 NOTE — Progress Notes (Signed)
Established Patient Office Visit  Subjective:  Patient ID: Emily Bailey, female    DOB: 1980-05-18  Age: 43 y.o. MRN: [id number]  Chief Complaint  Patient presents with   Follow-up    10 day follow up    Patient comes in for her follow-up today.  She has completed p.o. Augmentin and is not having any further urinary complaints.  No fevers chills, no nausea vomiting and no diarrhea.  Advised to continue drinking plenty of water. She is currently on her menstrual cycle.    No other concerns at this time.   Past Medical History:  Diagnosis Date   Esophagitis    Gastritis    Hematuria 01/2019   on dipstick, neg microscopy with urology; reassurance.    Thalassanemia    alpha thalassemia per labs; seeing [facility] genetic counselor    Past Surgical History:  Procedure Laterality Date   CESAREAN SECTION N/A 12/08/2014   Procedure: PRIMARY CESAREAN SECTION;  Surgeon: Conard Novak, MD;  Location: [facility] ORS;  Service: Obstetrics;  Laterality: N/A;   WISDOM TOOTH EXTRACTION      Social History   Socioeconomic History   Marital status: Married    Spouse name: Not on file   Number of children: Not on file   Years of education: Not on file   Highest education level: Not on file  Occupational History   Not on file  Tobacco Use   Smoking status: Never   Smokeless tobacco: Never  Vaping Use   Vaping status: Never Used  Substance and Sexual Activity   Alcohol use: No   Drug use: No   Sexual activity: Yes    Birth control/protection: Pill  Other Topics Concern   Not on file  Social History Narrative   Not on file   Social Drivers of Health   Financial Resource Strain: Not on file  Food Insecurity: Not on file  Transportation Needs: Not on file  Physical Activity: Not on file  Stress: Not on file  Social Connections: Not on file  Intimate Partner Violence: Not on file    Family History  Problem Relation Age of Onset   Kidney failure Father    Heart Problems  Father    Hypertension Father    Hyperlipidemia Father    Stroke Sister 23   Asthma Brother    Breast cancer Neg Hx    Ovarian cancer Neg Hx     No Known Allergies  Outpatient Medications Prior to Visit  Medication Sig   amoxicillin-clavulanate (AUGMENTIN) 875-125 MG tablet Take 1 tablet by mouth 2 (two) times daily. (Patient not taking: Reported on 04/27/2023)   cholecalciferol (VITAMIN D3) 25 MCG (1000 UNIT) tablet Take 2,000 Units by mouth daily. (Patient not taking: Reported on 04/27/2023)   Collagen-Vitamin C-Biotin (COLLAGEN 1500/C) 500-50-0.8 MG CAPS Take by mouth. (Patient not taking: Reported on 04/27/2023)   levonorgestrel-ethinyl estradiol (AVIANE) 0.1-20 MG-MCG tablet Take 1 tablet by mouth daily. (Patient not taking: Reported on 04/27/2023)   Multiple Vitamin (MULTIVITAMIN) tablet Take 1 tablet by mouth daily. (Patient not taking: Reported on 04/27/2023)   No facility-administered medications prior to visit.    Review of Systems  Constitutional: Negative.  Negative for chills, fever, malaise/fatigue and weight loss.  HENT: Negative.    Eyes: Negative.   Respiratory: Negative.  Negative for cough and shortness of breath.   Cardiovascular: Negative.  Negative for chest pain, palpitations and leg swelling.  Gastrointestinal: Negative.  Negative for abdominal pain, constipation,  diarrhea, heartburn, nausea and vomiting.  Genitourinary: Negative.  Negative for dysuria, flank pain, frequency and urgency.  Musculoskeletal: Negative.  Negative for back pain, joint pain and myalgias.  Skin: Negative.   Neurological: Negative.  Negative for dizziness and headaches.  Endo/Heme/Allergies: Negative.   Psychiatric/Behavioral: Negative.  Negative for depression and suicidal ideas. The patient is not nervous/anxious.        Objective:   BP 110/82   Pulse 72   Ht 4' 10.5" (1.486 m)   Wt 176 lb 9.6 oz (80.1 kg)   SpO2 98%   BMI 36.28 kg/m   Vitals:   04/27/23 0952  BP: 110/82   Pulse: 72  Height: 4' 10.5" (1.486 m)  Weight: 176 lb 9.6 oz (80.1 kg)  SpO2: 98%  BMI (Calculated): 36.28    Physical Exam Vitals and nursing note reviewed.  Constitutional:      Appearance: Normal appearance.  HENT:     Head: Normocephalic and atraumatic.     Nose: Nose normal.     Mouth/Throat:     Mouth: Mucous membranes are moist.     Pharynx: Oropharynx is clear.  Eyes:     Conjunctiva/sclera: Conjunctivae normal.     Pupils: Pupils are equal, round, and reactive to light.  Cardiovascular:     Rate and Rhythm: Normal rate and regular rhythm.     Pulses: Normal pulses.     Heart sounds: Normal heart sounds. No murmur heard. Pulmonary:     Effort: Pulmonary effort is normal.     Breath sounds: Normal breath sounds. No wheezing.  Abdominal:     General: Bowel sounds are normal.     Palpations: Abdomen is soft.     Tenderness: There is no abdominal tenderness. There is no right CVA tenderness or left CVA tenderness.  Musculoskeletal:        General: Normal range of motion.     Cervical back: Normal range of motion.     Right lower leg: No edema.     Left lower leg: No edema.  Skin:    General: Skin is warm and dry.  Neurological:     General: No focal deficit present.     Mental Status: She is alert and oriented to person, place, and time.  Psychiatric:        Mood and Affect: Mood normal.        Behavior: Behavior normal.      No results found for any visits on 04/27/23.  Recent Results (from the past 2160 hours)  POCT Urinalysis Dipstick (36644)     Status: None   Collection Time: 04/15/23  1:00 PM  Result Value Ref Range   Color, UA yellow    Clarity, UA clear    Glucose, UA Negative Negative   Bilirubin, UA neg    Ketones, UA pos    Spec Grav, UA 1.020 1.010 - 1.025   Blood, UA pos    pH, UA 6.0 5.0 - 8.0   Protein, UA Negative Negative   Urobilinogen, UA 0.2 0.2 or 1.0 E.U./dL   Nitrite, UA neg    Leukocytes, UA Negative Negative   Appearance  clear    Odor yes   POCT Urinalysis Dipstick ([id number])     Status: Abnormal   Collection Time: 04/16/23 11:44 AM  Result Value Ref Range   Color, UA yellow    Clarity, UA clear    Glucose, UA Negative Negative   Bilirubin, UA neg  Ketones, UA pos    Spec Grav, UA 1.025 1.010 - 1.025   Blood, UA trace    pH, UA 7.0 5.0 - 8.0   Protein, UA Positive (A) Negative   Urobilinogen, UA 0.2 0.2 or 1.0 E.U./dL   Nitrite, UA neg    Leukocytes, UA Small (1+) (A) Negative   Appearance clear    Odor yes       Assessment & Plan:  Patient encouraged to drink plenty of water every day. Problem List Items Addressed This Visit     Gastroesophageal reflux disease without esophagitis   Urinary urgency - Primary    Follow up as needed.  Total time spent: 25 minutes  Margaretann Loveless, MD  04/27/2023   This document may have been prepared by [facility] Voice Recognition software and as such may include unintentional dictation errors.

## 2023-05-01 ENCOUNTER — Telehealth: Payer: Self-pay

## 2023-05-01 NOTE — Telephone Encounter (Signed)
Left pt vm to schedule orthotic pick up

## 2023-05-14 ENCOUNTER — Telehealth: Payer: Self-pay

## 2023-05-14 NOTE — Telephone Encounter (Signed)
 Taking orthos to [facility] 2/7

## 2023-05-19 ENCOUNTER — Ambulatory Visit: Payer: Managed Care, Other (non HMO) | Admitting: Internal Medicine

## 2023-05-19 ENCOUNTER — Encounter: Payer: Self-pay | Admitting: Internal Medicine

## 2023-05-19 VITALS — BP 126/84 | HR 88 | Ht 58.5 in | Wt 177.8 lb

## 2023-05-19 DIAGNOSIS — R002 Palpitations: Secondary | ICD-10-CM | POA: Diagnosis not present

## 2023-05-19 DIAGNOSIS — Z0130 Encounter for examination of blood pressure without abnormal findings: Secondary | ICD-10-CM

## 2023-05-19 DIAGNOSIS — R5383 Other fatigue: Secondary | ICD-10-CM | POA: Diagnosis not present

## 2023-05-19 DIAGNOSIS — K219 Gastro-esophageal reflux disease without esophagitis: Secondary | ICD-10-CM | POA: Diagnosis not present

## 2023-05-19 NOTE — Progress Notes (Signed)
Established Patient Office Visit  Subjective:  Patient ID: Emily Bailey, female    DOB: [birth year]  Age: 43 y.o. MRN: [id number]  Chief Complaint  Patient presents with   Shortness of Breath    Flutters in chest and SOB    Patient comes in with 2-day history of intermittent palpitations, felt fluttering in her chest.  And at the time she also had some difficulty breathing.  She had several episodes over the weekend and yesterday.  However today she is feeling better.  Her EKG done in the office shows normal sinus rhythm at 84 bpm.  Patient has felt intermittent palpitations in the past also.  She had an echocardiogram previously which showed moderate mitral valve regurgitation.  Will repeat her echocardiogram and set up a cardiology referral, may need a Holter monitor.  Today she is not having palpitations, no chest pain, no cough, no congestion and no fevers or chills.  She does not use caffeine excessively or OTC cold medicine.  However admits to being under stress at work    No other concerns at this time.   Past Medical History:  Diagnosis Date   Esophagitis    Gastritis    Hematuria 01/2019   on dipstick, neg microscopy with urology; reassurance.    Thalassanemia    alpha thalassemia per labs; seeing [facility] genetic counselor    Past Surgical History:  Procedure Laterality Date   CESAREAN SECTION N/A 12/08/2014   Procedure: PRIMARY CESAREAN SECTION;  Surgeon: Conard Novak, MD;  Location: [facility] ORS;  Service: Obstetrics;  Laterality: N/A;   WISDOM TOOTH EXTRACTION      Social History   Socioeconomic History   Marital status: Married    Spouse name: Not on file   Number of children: Not on file   Years of education: Not on file   Highest education level: Not on file  Occupational History   Not on file  Tobacco Use   Smoking status: Never   Smokeless tobacco: Never  Vaping Use   Vaping status: Never Used  Substance and Sexual Activity   Alcohol use: No   Drug  use: No   Sexual activity: Yes    Birth control/protection: Pill  Other Topics Concern   Not on file  Social History Narrative   Not on file   Social Drivers of Health   Financial Resource Strain: Not on file  Food Insecurity: Not on file  Transportation Needs: Not on file  Physical Activity: Not on file  Stress: Not on file  Social Connections: Not on file  Intimate Partner Violence: Not on file    Family History  Problem Relation Age of Onset   Kidney failure Father    Heart Problems Father    Hypertension Father    Hyperlipidemia Father    Stroke Sister 44   Asthma Brother    Breast cancer Neg Hx    Ovarian cancer Neg Hx     No Known Allergies  Outpatient Medications Prior to Visit  Medication Sig   amoxicillin-clavulanate (AUGMENTIN) 875-125 MG tablet Take 1 tablet by mouth 2 (two) times daily. (Patient not taking: Reported on 05/19/2023)   cholecalciferol (VITAMIN D3) 25 MCG (1000 UNIT) tablet Take 2,000 Units by mouth daily. (Patient not taking: Reported on 05/19/2023)   Collagen-Vitamin C-Biotin (COLLAGEN 1500/C) 500-50-0.8 MG CAPS Take by mouth. (Patient not taking: Reported on 05/19/2023)   levonorgestrel-ethinyl estradiol (AVIANE) 0.1-20 MG-MCG tablet Take 1 tablet by mouth daily. (Patient not  taking: Reported on 05/19/2023)   Multiple Vitamin (MULTIVITAMIN) tablet Take 1 tablet by mouth daily. (Patient not taking: Reported on 05/19/2023)   No facility-administered medications prior to visit.    Review of Systems  Constitutional:  Positive for malaise/fatigue. Negative for chills, diaphoresis, fever and weight loss.  HENT: Negative.  Negative for congestion, nosebleeds, sinus pain and sore throat.   Eyes: Negative.   Respiratory: Negative.  Negative for cough and shortness of breath.   Cardiovascular:  Positive for palpitations. Negative for chest pain and leg swelling.  Gastrointestinal: Negative.  Negative for abdominal pain, constipation, diarrhea, heartburn,  nausea and vomiting.  Genitourinary: Negative.  Negative for dysuria and flank pain.  Musculoskeletal: Negative.  Negative for joint pain and myalgias.  Skin: Negative.   Neurological: Negative.  Negative for dizziness and headaches.  Endo/Heme/Allergies: Negative.   Psychiatric/Behavioral: Negative.  Negative for depression and suicidal ideas. The patient is not nervous/anxious.        Objective:   BP 126/84   Pulse 88   Ht 4' 10.5" (1.486 m)   Wt 177 lb 12.8 oz (80.6 kg)   SpO2 99%   BMI 36.53 kg/m   Vitals:   05/19/23 1445  BP: 126/84  Pulse: 88  Height: 4' 10.5" (1.486 m)  Weight: 177 lb 12.8 oz (80.6 kg)  SpO2: 99%  BMI (Calculated): 36.52    Physical Exam Vitals and nursing note reviewed.  Constitutional:      Appearance: Normal appearance.  HENT:     Head: Normocephalic and atraumatic.     Nose: Nose normal.     Mouth/Throat:     Mouth: Mucous membranes are moist.     Pharynx: Oropharynx is clear.  Eyes:     Conjunctiva/sclera: Conjunctivae normal.     Pupils: Pupils are equal, round, and reactive to light.  Cardiovascular:     Rate and Rhythm: Normal rate and regular rhythm.     Pulses: Normal pulses.     Heart sounds: Normal heart sounds. No murmur heard. Pulmonary:     Effort: Pulmonary effort is normal.     Breath sounds: Normal breath sounds. No wheezing.  Abdominal:     General: Bowel sounds are normal.     Palpations: Abdomen is soft.     Tenderness: There is no abdominal tenderness. There is no right CVA tenderness or left CVA tenderness.  Musculoskeletal:        General: Normal range of motion.     Cervical back: Normal range of motion.     Right lower leg: No edema.     Left lower leg: No edema.  Skin:    General: Skin is warm and dry.  Neurological:     General: No focal deficit present.     Mental Status: She is alert and oriented to person, place, and time.  Psychiatric:        Mood and Affect: Mood normal.        Behavior:  Behavior normal.      No results found for any visits on 05/19/23.  Recent Results (from the past 2160 hours)  POCT Urinalysis Dipstick ([id number])     Status: None   Collection Time: 04/15/23  1:00 PM  Result Value Ref Range   Color, UA yellow    Clarity, UA clear    Glucose, UA Negative Negative   Bilirubin, UA neg    Ketones, UA pos    Spec Grav, UA 1.020 1.010 - 1.025  Blood, UA pos    pH, UA 6.0 5.0 - 8.0   Protein, UA Negative Negative   Urobilinogen, UA 0.2 0.2 or 1.0 E.U./dL   Nitrite, UA neg    Leukocytes, UA Negative Negative   Appearance clear    Odor yes   POCT Urinalysis Dipstick ([id number])     Status: Abnormal   Collection Time: 04/16/23 11:44 AM  Result Value Ref Range   Color, UA yellow    Clarity, UA clear    Glucose, UA Negative Negative   Bilirubin, UA neg    Ketones, UA pos    Spec Grav, UA 1.025 1.010 - 1.025   Blood, UA trace    pH, UA 7.0 5.0 - 8.0   Protein, UA Positive (A) Negative   Urobilinogen, UA 0.2 0.2 or 1.0 E.U./dL   Nitrite, UA neg    Leukocytes, UA Small (1+) (A) Negative   Appearance clear    Odor yes       Assessment & Plan:  Check labs today.  Schedule echocardiogram and cardiology referral.  Advised to avoid caffeine completely. Problem List Items Addressed This Visit     Gastroesophageal reflux disease without esophagitis   Other Visit Diagnoses       Palpitation    -  Primary   Relevant Orders   CBC with Diff   TSH   Magnesium   PCV ECHOCARDIOGRAM COMPLETE   EKG 12-Lead   Ambulatory referral to Cardiology     Other fatigue       Relevant Orders   CBC with Diff   CMP14+EGFR       Follow up as scheduled.  Total time spent: 30 minutes  Margaretann Loveless, MD  05/19/2023   This document may have been prepared by [employer] Voice Recognition software and as such may include unintentional dictation errors.

## 2023-05-20 LAB — CBC WITH DIFFERENTIAL/PLATELET
Basophils Absolute: 0 10*3/uL (ref 0.0–0.2)
Basos: 0 %
EOS (ABSOLUTE): 0.1 10*3/uL (ref 0.0–0.4)
Eos: 1 %
Hematocrit: 40.6 % (ref 34.0–46.6)
Hemoglobin: 12.9 g/dL (ref 11.1–15.9)
Immature Grans (Abs): 0 10*3/uL (ref 0.0–0.1)
Immature Granulocytes: 0 %
Lymphocytes Absolute: 2.9 10*3/uL (ref 0.7–3.1)
Lymphs: 37 %
MCH: 21.5 pg — ABNORMAL LOW (ref 26.6–33.0)
MCHC: 31.8 g/dL (ref 31.5–35.7)
MCV: 68 fL — ABNORMAL LOW (ref 79–97)
Monocytes Absolute: 0.4 10*3/uL (ref 0.1–0.9)
Monocytes: 6 %
Neutrophils Absolute: 4.3 10*3/uL (ref 1.4–7.0)
Neutrophils: 56 %
Platelets: 388 10*3/uL (ref 150–450)
RBC: 5.99 x10E6/uL — ABNORMAL HIGH (ref 3.77–5.28)
RDW: 13.5 % (ref 11.7–15.4)
WBC: 7.7 10*3/uL (ref 3.4–10.8)

## 2023-05-20 LAB — CMP14+EGFR
ALT: 13 [IU]/L (ref 0–32)
AST: 15 [IU]/L (ref 0–40)
Albumin: 4.1 g/dL (ref 3.9–4.9)
Alkaline Phosphatase: 79 [IU]/L (ref 44–121)
BUN/Creatinine Ratio: 22 (ref 9–23)
BUN: 14 mg/dL (ref 6–24)
Bilirubin Total: 0.2 mg/dL (ref 0.0–1.2)
CO2: 22 mmol/L (ref 20–29)
Calcium: 9.1 mg/dL (ref 8.7–10.2)
Chloride: 102 mmol/L (ref 96–106)
Creatinine, Ser: 0.65 mg/dL (ref 0.57–1.00)
Globulin, Total: 2.6 g/dL (ref 1.5–4.5)
Glucose: 89 mg/dL (ref 70–99)
Potassium: 3.7 mmol/L (ref 3.5–5.2)
Sodium: 138 mmol/L (ref 134–144)
Total Protein: 6.7 g/dL (ref 6.0–8.5)
eGFR: 112 mL/min/{1.73_m2} (ref >59)

## 2023-05-20 LAB — MAGNESIUM: Magnesium: 1.9 mg/dL (ref 1.6–2.3)

## 2023-05-20 LAB — TSH: TSH: 1.58 u[IU]/mL (ref 0.450–4.500)

## 2023-05-28 NOTE — Progress Notes (Signed)
 Patient notified

## 2023-06-12 ENCOUNTER — Ambulatory Visit: Payer: Managed Care, Other (non HMO)

## 2023-06-12 NOTE — Progress Notes (Signed)
Patient presents today to pick up custom molded foot orthotics, diagnosed with PF by Dr. Allena Katz.   Orthotics were dispensed and fit was satisfactory. Reviewed instructions for break-in and wear. Written instructions given to patient.  Patient will follow up as needed.   Addison Bailey Cped, CFo, CFm

## 2023-06-15 ENCOUNTER — Ambulatory Visit (INDEPENDENT_AMBULATORY_CARE_PROVIDER_SITE_OTHER): Payer: Managed Care, Other (non HMO)

## 2023-06-15 DIAGNOSIS — I371 Nonrheumatic pulmonary valve insufficiency: Secondary | ICD-10-CM

## 2023-06-15 DIAGNOSIS — I361 Nonrheumatic tricuspid (valve) insufficiency: Secondary | ICD-10-CM | POA: Diagnosis not present

## 2023-06-15 DIAGNOSIS — I351 Nonrheumatic aortic (valve) insufficiency: Secondary | ICD-10-CM | POA: Diagnosis not present

## 2023-06-15 DIAGNOSIS — I340 Nonrheumatic mitral (valve) insufficiency: Secondary | ICD-10-CM | POA: Diagnosis not present

## 2023-06-15 DIAGNOSIS — R002 Palpitations: Secondary | ICD-10-CM

## 2023-06-22 ENCOUNTER — Ambulatory Visit: Payer: Managed Care, Other (non HMO) | Admitting: Cardiovascular Disease

## 2023-06-22 ENCOUNTER — Encounter: Payer: Self-pay | Admitting: Cardiovascular Disease

## 2023-06-22 VITALS — BP 131/78 | HR 84 | Ht <= 58 in | Wt 174.0 lb

## 2023-06-22 DIAGNOSIS — K219 Gastro-esophageal reflux disease without esophagitis: Secondary | ICD-10-CM | POA: Diagnosis not present

## 2023-06-22 DIAGNOSIS — I340 Nonrheumatic mitral (valve) insufficiency: Secondary | ICD-10-CM | POA: Diagnosis not present

## 2023-06-22 DIAGNOSIS — R002 Palpitations: Secondary | ICD-10-CM | POA: Diagnosis not present

## 2023-06-22 MED ORDER — METOPROLOL SUCCINATE ER 25 MG PO TB24
25.0000 mg | ORAL_TABLET | Freq: Every day | ORAL | 11 refills | Status: AC
Start: 2023-06-22 — End: 2024-06-21

## 2023-06-22 NOTE — Progress Notes (Signed)
 Cardiology Office Note   Date:  06/22/2023   ID:  Albert, Devaul [birth year], MRN [id number]  PCP:  Margaretann Loveless, MD  Cardiologist:  Adrian Blackwater, MD      History of Present Illness: Emily Bailey is a 43 y.o. female who presents for  Chief Complaint  Patient presents with   Follow-up    Echo results Consult     Feels flutter in chest  Palpitations  This is a new problem. The current episode started 1 to 4 weeks ago. The problem has been waxing and waning. Associated symptoms include an irregular heartbeat. Pertinent negatives include no chest fullness.      Past Medical History:  Diagnosis Date   Esophagitis    Gastritis    Hematuria 01/2019   on dipstick, neg microscopy with urology; reassurance.    Thalassanemia    alpha thalassemia per labs; seeing [facility] genetic counselor     Past Surgical History:  Procedure Laterality Date   CESAREAN SECTION N/A 12/08/2014   Procedure: PRIMARY CESAREAN SECTION;  Surgeon: Conard Novak, MD;  Location: [facility] ORS;  Service: Obstetrics;  Laterality: N/A;   WISDOM TOOTH EXTRACTION       Current Outpatient Medications  Medication Sig Dispense Refill   metoprolol succinate (TOPROL XL) 25 MG 24 hr tablet Take 1 tablet (25 mg total) by mouth daily. 30 tablet 11   amoxicillin-clavulanate (AUGMENTIN) 875-125 MG tablet Take 1 tablet by mouth 2 (two) times daily. (Patient not taking: Reported on 05/19/2023) 14 tablet 0   cholecalciferol (VITAMIN D3) 25 MCG (1000 UNIT) tablet Take 2,000 Units by mouth daily. (Patient not taking: Reported on 05/19/2023)     Collagen-Vitamin C-Biotin (COLLAGEN 1500/C) 500-50-0.8 MG CAPS Take by mouth. (Patient not taking: Reported on 05/19/2023)     levonorgestrel-ethinyl estradiol (AVIANE) 0.1-20 MG-MCG tablet Take 1 tablet by mouth daily. (Patient not taking: Reported on 05/19/2023) 84 tablet 3   Multiple Vitamin (MULTIVITAMIN) tablet Take 1 tablet by mouth daily. (Patient not taking: Reported on  05/19/2023)     No current facility-administered medications for this visit.    Allergies:   Patient has no known allergies.    Social History:   reports that she has never smoked. She has never used smokeless tobacco. She reports that she does not drink alcohol and does not use drugs.   Family History:  family history includes Asthma in her brother; Heart Problems in her father; Hyperlipidemia in her father; Hypertension in her father; Kidney failure in her father; Stroke (age of onset: 82) in her sister.    ROS:     Review of Systems  Constitutional: Negative.   HENT: Negative.    Eyes: Negative.   Respiratory: Negative.    Cardiovascular:  Positive for palpitations.  Gastrointestinal: Negative.   Genitourinary: Negative.   Musculoskeletal: Negative.   Skin: Negative.   Neurological: Negative.   Endo/Heme/Allergies: Negative.   Psychiatric/Behavioral: Negative.    All other systems reviewed and are negative.     All other systems are reviewed and negative.    PHYSICAL EXAM: VS:  BP 131/78   Pulse 84   Ht 4\' 10"  (1.473 m)   Wt 174 lb (78.9 kg)   SpO2 99%   BMI 36.37 kg/m  , BMI Body mass index is 36.37 kg/m. Last weight:  Wt Readings from Last 3 Encounters:  06/22/23 174 lb (78.9 kg)  05/19/23 177 lb 12.8 oz (80.6 kg)  04/27/23 176 lb  9.6 oz (80.1 kg)     Physical Exam Constitutional:      Appearance: Normal appearance.  Cardiovascular:     Rate and Rhythm: Normal rate and regular rhythm.     Heart sounds: Normal heart sounds.  Pulmonary:     Effort: Pulmonary effort is normal.     Breath sounds: Normal breath sounds.  Musculoskeletal:     Right lower leg: No edema.     Left lower leg: No edema.  Neurological:     Mental Status: She is alert.       EKG:   Recent Labs: 05/19/2023: ALT 13; BUN 14; Creatinine, Ser 0.65; Hemoglobin 12.9; Magnesium 1.9; Platelets 388; Potassium 3.7; Sodium 138; TSH 1.580    Lipid Panel No results found for:  "CHOL", "TRIG", "HDL", "CHOLHDL", "VLDL", "LDLCALC", "LDLDIRECT"    Other studies Reviewed: Additional studies/ records that were reviewed today include:  Review of the above records demonstrates:       No data to display            ASSESSMENT AND PLAN:    ICD-10-CM   1. Palpitation  R00.2 metoprolol succinate (TOPROL XL) 25 MG 24 hr tablet   Has occasional palpitaion, without chest pain may be due to MR. Add metoprolol 25    2. Nonrheumatic mitral valve regurgitation  I34.0 metoprolol succinate (TOPROL XL) 25 MG 24 hr tablet   mild, normal LVEF    3. Gastroesophageal reflux disease without esophagitis  K21.9 metoprolol succinate (TOPROL XL) 25 MG 24 hr tablet       Problem List Items Addressed This Visit       Digestive   Gastroesophageal reflux disease without esophagitis   Relevant Medications   metoprolol succinate (TOPROL XL) 25 MG 24 hr tablet   Other Visit Diagnoses       Palpitation    -  Primary   Has occasional palpitaion, without chest pain may be due to MR. Add metoprolol 25   Relevant Medications   metoprolol succinate (TOPROL XL) 25 MG 24 hr tablet     Nonrheumatic mitral valve regurgitation       mild, normal LVEF   Relevant Medications   metoprolol succinate (TOPROL XL) 25 MG 24 hr tablet          Disposition:   Return in about 4 weeks (around 07/20/2023).    Total time spent: 45 minutes  Signed,  Adrian Blackwater, MD  06/22/2023 10:31 AM    [facility]

## 2023-07-20 ENCOUNTER — Ambulatory Visit: Admitting: Cardiovascular Disease

## 2023-08-28 ENCOUNTER — Ambulatory Visit (INDEPENDENT_AMBULATORY_CARE_PROVIDER_SITE_OTHER)

## 2023-08-28 ENCOUNTER — Other Ambulatory Visit (HOSPITAL_COMMUNITY)
Admission: RE | Admit: 2023-08-28 | Discharge: 2023-08-28 | Disposition: A | Discharge status: Home / Self Care | Source: Ambulatory Visit | Attending: Obstetrics and Gynecology | Admitting: Obstetrics and Gynecology

## 2023-08-28 VITALS — BP 117/77 | HR 80 | Ht 58.5 in | Wt 177.3 lb

## 2023-08-28 DIAGNOSIS — B3731 Acute candidiasis of vulva and vagina: Secondary | ICD-10-CM | POA: Insufficient documentation

## 2023-08-28 DIAGNOSIS — Z202 Contact with and (suspected) exposure to infections with a predominantly sexual mode of transmission: Secondary | ICD-10-CM | POA: Insufficient documentation

## 2023-08-28 NOTE — Progress Notes (Signed)
    NURSE VISIT NOTE  Subjective:    Patient ID: Emily Bailey, female    DOB: 22-Apr-1980, 43 y.o.   MRN: [id number]  HPI  Patient is a 43 y.o. G81P1011 female who presents for clear vaginal discharge for 3 week(s). Patient denies history of known exposure to STD.   Objective:    BP 117/77   Pulse 80   Ht 4' 10.5" (1.486 m)   Wt 177 lb 4.8 oz (80.4 kg)   BMI 36.43 kg/m    @THIS  VISIT ONLY@  Assessment:   1. Vaginal yeast infection   2. Possible exposure to STD       Plan:   GC and chlamydia DNA  probe sent to lab. Treatment: Await Results for further treatment. ROV prn if symptoms persist or worsen.   Aldona Amel, CMA

## 2023-09-02 ENCOUNTER — Other Ambulatory Visit: Payer: Self-pay | Admitting: Obstetrics

## 2023-09-02 ENCOUNTER — Encounter: Payer: Self-pay | Admitting: Obstetrics

## 2023-09-02 DIAGNOSIS — B3731 Acute candidiasis of vulva and vagina: Secondary | ICD-10-CM | POA: Insufficient documentation

## 2023-09-02 DIAGNOSIS — B9689 Other specified bacterial agents as the cause of diseases classified elsewhere: Secondary | ICD-10-CM | POA: Insufficient documentation

## 2023-09-02 LAB — CERVICOVAGINAL ANCILLARY ONLY
Bacterial Vaginitis (gardnerella): POSITIVE — AB
Candida Glabrata: NEGATIVE
Candida Vaginitis: POSITIVE — AB
Chlamydia: NEGATIVE
Comment: NEGATIVE
Comment: NEGATIVE
Comment: NEGATIVE
Comment: NEGATIVE
Comment: NEGATIVE
Comment: NORMAL
Neisseria Gonorrhea: NEGATIVE
Trichomonas: NEGATIVE

## 2023-09-02 MED ORDER — METRONIDAZOLE 500 MG PO TABS
500.0000 mg | ORAL_TABLET | Freq: Two times a day (BID) | ORAL | 0 refills | Status: DC
Start: 2023-09-02 — End: 2023-11-19

## 2023-09-02 MED ORDER — FLUCONAZOLE 150 MG PO TABS: 150.0000 mg | ORAL_TABLET | Freq: Once | ORAL | 1 refills | Status: AC

## 2023-09-02 NOTE — Progress Notes (Signed)
+  BV and yeast. Rx sent for metronidazole and diflucan . Corinda notified via MyChart.  Tashica Provencio M Taetum Flewellen, CNM

## 2023-09-10 ENCOUNTER — Other Ambulatory Visit: Payer: Self-pay

## 2023-09-10 DIAGNOSIS — R1031 Right lower quadrant pain: Secondary | ICD-10-CM | POA: Diagnosis present

## 2023-09-10 DIAGNOSIS — M545 Low back pain, unspecified: Secondary | ICD-10-CM | POA: Insufficient documentation

## 2023-09-10 LAB — CBC
HCT: 40.7 % (ref 36.0–46.0)
Hemoglobin: 13.1 g/dL (ref 12.0–15.0)
MCH: 21.5 pg — ABNORMAL LOW (ref 26.0–34.0)
MCHC: 32.2 g/dL (ref 30.0–36.0)
MCV: 66.7 fL — ABNORMAL LOW (ref 80.0–100.0)
Platelets: 388 10*3/uL (ref 150–400)
RBC: 6.1 MIL/uL — ABNORMAL HIGH (ref 3.87–5.11)
RDW: 15.2 % (ref 11.5–15.5)
WBC: 6.6 10*3/uL (ref 4.0–10.5)
nRBC: 0 % (ref 0.0–0.2)

## 2023-09-10 LAB — COMPREHENSIVE METABOLIC PANEL WITH GFR
ALT: 14 U/L (ref 0–44)
AST: 26 U/L (ref 15–41)
Albumin: 3.7 g/dL (ref 3.5–5.0)
Alkaline Phosphatase: 52 U/L (ref 38–126)
Anion gap: 8 (ref 5–15)
BUN: 16 mg/dL (ref 6–20)
CO2: 22 mmol/L (ref 22–32)
Calcium: 8.8 mg/dL — ABNORMAL LOW (ref 8.9–10.3)
Chloride: 111 mmol/L (ref 98–111)
Creatinine, Ser: 0.57 mg/dL (ref 0.44–1.00)
GFR, Estimated: >60 mL/min (ref >60)
Glucose, Bld: 114 mg/dL — ABNORMAL HIGH (ref 70–99)
Potassium: 4 mmol/L (ref 3.5–5.1)
Sodium: 141 mmol/L (ref 135–145)
Total Bilirubin: 0.7 mg/dL (ref 0.0–1.2)
Total Protein: 7.2 g/dL (ref 6.5–8.1)

## 2023-09-10 LAB — LIPASE, BLOOD: Lipase: 52 U/L — ABNORMAL HIGH (ref 11–51)

## 2023-09-10 NOTE — ED Triage Notes (Signed)
 Pt to ED via POV c/o right flank pain. Started Monday went away then came back today. Pt was having RLQ pain on Sunday. Denies any pain at this time. Was having some lightheadedness earlier today but denies any at this time. Denies CP, SOB, fevers, dizziness.

## 2023-09-11 ENCOUNTER — Emergency Department
Admission: EM | Admit: 2023-09-11 | Discharge: 2023-09-11 | Disposition: A | Discharge status: Home / Self Care | Attending: Emergency Medicine | Admitting: Emergency Medicine

## 2023-09-11 ENCOUNTER — Emergency Department

## 2023-09-11 DIAGNOSIS — M545 Low back pain, unspecified: Secondary | ICD-10-CM

## 2023-09-11 DIAGNOSIS — R1031 Right lower quadrant pain: Secondary | ICD-10-CM

## 2023-09-11 LAB — CHLAMYDIA/NGC RT PCR (ARMC ONLY)
Chlamydia Tr: NOT DETECTED
N gonorrhoeae: NOT DETECTED

## 2023-09-11 LAB — URINALYSIS, ROUTINE W REFLEX MICROSCOPIC
Bacteria, UA: NONE SEEN
Bilirubin Urine: NEGATIVE
Glucose, UA: NEGATIVE mg/dL
Ketones, ur: NEGATIVE mg/dL
Leukocytes,Ua: NEGATIVE
Nitrite: NEGATIVE
Protein, ur: NEGATIVE mg/dL
Specific Gravity, Urine: 1.023 (ref 1.005–1.030)
pH: 6 (ref 5.0–8.0)

## 2023-09-11 LAB — WET PREP, GENITAL
Clue Cells Wet Prep HPF POC: NONE SEEN
Sperm: NONE SEEN
Trich, Wet Prep: NONE SEEN
WBC, Wet Prep HPF POC: >=10 — AB (ref <10)
Yeast Wet Prep HPF POC: NONE SEEN

## 2023-09-11 LAB — HCG, QUANTITATIVE, PREGNANCY: hCG, Beta Chain, Quant, S: <1 m[IU]/mL (ref <5)

## 2023-09-11 MED ORDER — MORPHINE SULFATE (PF) 4 MG/ML IV SOLN
4.0000 mg | Freq: Once | INTRAVENOUS | Status: AC
  Administered 2023-09-11: 4 mg via INTRAVENOUS
  Filled 2023-09-11: qty 1

## 2023-09-11 MED ORDER — IBUPROFEN 800 MG PO TABS: 800.0000 mg | ORAL_TABLET | Freq: Three times a day (TID) | ORAL | 0 refills | Status: AC | PRN

## 2023-09-11 MED ORDER — LIDOCAINE 5 % EX PTCH: 1.0000 | MEDICATED_PATCH | CUTANEOUS | 0 refills | Status: AC

## 2023-09-11 MED ORDER — SODIUM CHLORIDE 0.9 % IV BOLUS (SEPSIS)
1000.0000 mL | Freq: Once | INTRAVENOUS | Status: AC
  Administered 2023-09-11: 1000 mL via INTRAVENOUS

## 2023-09-11 MED ORDER — LIDOCAINE 5 % EX PTCH
1.0000 | MEDICATED_PATCH | Freq: Once | CUTANEOUS | Status: DC
  Administered 2023-09-11: 1 via TRANSDERMAL
  Filled 2023-09-11: qty 1

## 2023-09-11 MED ORDER — ONDANSETRON HCL 4 MG/2ML IJ SOLN
4.0000 mg | Freq: Once | INTRAMUSCULAR | Status: AC
  Administered 2023-09-11: 4 mg via INTRAVENOUS
  Filled 2023-09-11: qty 2

## 2023-09-11 MED ORDER — OXYCODONE-ACETAMINOPHEN 5-325 MG PO TABS: 2.0000 | ORAL_TABLET | Freq: Three times a day (TID) | ORAL | 0 refills | Status: AC | PRN

## 2023-09-11 MED ORDER — ONDANSETRON 4 MG PO TBDP
4.0000 mg | ORAL_TABLET | Freq: Four times a day (QID) | ORAL | 0 refills | Status: AC | PRN
Start: 2023-09-11 — End: ?

## 2023-09-11 MED ORDER — KETOROLAC TROMETHAMINE 30 MG/ML IJ SOLN
30.0000 mg | Freq: Once | INTRAMUSCULAR | Status: AC
  Administered 2023-09-11: 30 mg via INTRAVENOUS
  Filled 2023-09-11: qty 1

## 2023-09-11 NOTE — Discharge Instructions (Addendum)

## 2023-09-11 NOTE — ED Provider Notes (Signed)
 [facility] Provider Note    Event Date/Time   First MD Initiated Contact with Patient 09/11/23 [phone]     (approximate)   History   Flank Pain   HPI  Emily Bailey is a 43 y.o. female with history of esophagitis, gastritis, previous C-section who presents to the emergency department with complaints of right lower abdominal pain for the past few days, nausea and vomiting.  Pain worse with staying still.  Denies any injury, increased physical exertion but does complain of pain in her back, buttock, hip.  Denies dysuria, hematuria, vaginal bleeding or discharge.  She is taking metronidazole  for bacterial vaginosis and took Diflucan  for a yeast infection prescribed by her OB/GYN.  She denies any dysuria.  States that that when she is reaching down to wipe she has discomfort.  States she has felt lightheaded.   History provided by patient, family.    Past Medical History:  Diagnosis Date   Esophagitis    Gastritis    Hematuria 01/2019   on dipstick, neg microscopy with urology; reassurance.    Thalassanemia    alpha thalassemia per labs; seeing [facility] genetic counselor    Past Surgical History:  Procedure Laterality Date   CESAREAN SECTION N/A 12/08/2014   Procedure: PRIMARY CESAREAN SECTION;  Surgeon: Kris Pester, MD;  Location: [facility] ORS;  Service: Obstetrics;  Laterality: N/A;   WISDOM TOOTH EXTRACTION      MEDICATIONS:  Prior to Admission medications   Medication Sig Start Date End Date Taking? Authorizing Provider  metroNIDAZOLE  (FLAGYL ) 500 MG tablet Take 1 tablet (500 mg total) by mouth 2 (two) times daily. 09/02/23   Phylliss Brenner, CNM  amoxicillin -clavulanate (AUGMENTIN ) 875-125 MG tablet Take 1 tablet by mouth 2 (two) times daily. Patient not taking: Reported on 04/27/2023 04/16/23   Aisha Hove, MD  cholecalciferol (VITAMIN D3) 25 MCG (1000 UNIT) tablet Take 2,000 Units by mouth daily.    [provider]  Collagen-Vitamin  C-Biotin (COLLAGEN 1500/C) 500-50-0.8 MG CAPS Take by mouth. Patient not taking: Reported on 08/28/2023    [provider]  levonorgestrel -ethinyl estradiol (AVIANE) 0.1-20 MG-MCG tablet Take 1 tablet by mouth daily. 11/17/22   Copland, Alicia B, PA-C  metoprolol  succinate (TOPROL  XL) 25 MG 24 hr tablet Take 1 tablet (25 mg total) by mouth daily. 06/22/23 06/21/24  Cherrie Cornwall, MD  Multiple Vitamin (MULTIVITAMIN) tablet Take 1 tablet by mouth daily. Patient not taking: Reported on 05/19/2023    [provider]    Physical Exam   Triage Vital Signs: ED Triage Vitals  Encounter Vitals Group     BP 09/10/23 2154 138/83     Systolic BP Percentile --      Diastolic BP Percentile --      Pulse Rate 09/10/23 2154 81     Resp 09/10/23 2154 18     Temp 09/10/23 2154 97.8 F (36.6 C)     Temp Source 09/10/23 2154 Oral     SpO2 09/10/23 2154 100 %     Weight 09/10/23 2152 174 lb (78.9 kg)     Height 09/10/23 2152 4\' 10"  (1.473 m)     Head Circumference --      Peak Flow --      Pain Score 09/10/23 2152 4     Pain Loc --      Pain Education --      Exclude from Growth Chart --     Most recent  vital signs: Vitals:   09/10/23 2154  BP: 138/83  Pulse: 81  Resp: 18  Temp: 97.8 F (36.6 C)  SpO2: 100%    CONSTITUTIONAL: Alert, responds appropriately to questions. Well-appearing; well-nourished HEAD: Normocephalic, atraumatic EYES: Conjunctivae clear, pupils appear equal, sclera nonicteric ENT: normal nose; moist mucous membranes NECK: Supple, normal ROM CARD: RRR; S1 and S2 appreciated RESP: Normal chest excursion without splinting or tachypnea; breath sounds clear and equal bilaterally; no wheezes, no rhonchi, no rales, no hypoxia or respiratory distress, speaking full sentences ABD/GI: Non-distended; soft, non-tender, no rebound, no guarding, no peritoneal signs, no tenderness at McBurney's point BACK: The back appears normal, tender over the right SI joint, no  midline spinal tenderness or step-off or deformity EXT: Normal ROM in all joints; no deformity noted, no edema, full range of motion in the right hip SKIN: Normal color for age and race; warm; no rash on exposed skin NEURO: Moves all extremities equally, normal speech PSYCH: The patient's mood and manner are appropriate.   ED Results / Procedures / Treatments   LABS: (all labs ordered are listed, but only abnormal results are displayed) Labs Reviewed  WET PREP, GENITAL - Abnormal; Notable for the following components:      Result Value   WBC, Wet Prep HPF POC >=10 (*)    All other components within normal limits  LIPASE, BLOOD - Abnormal; Notable for the following components:   Lipase 52 (*)    All other components within normal limits  COMPREHENSIVE METABOLIC PANEL WITH GFR - Abnormal; Notable for the following components:   Glucose, Bld 114 (*)    Calcium 8.8 (*)    All other components within normal limits  CBC - Abnormal; Notable for the following components:   RBC 6.10 (*)    MCV 66.7 (*)    MCH 21.5 (*)    All other components within normal limits  URINALYSIS, ROUTINE W REFLEX MICROSCOPIC - Abnormal; Notable for the following components:   Color, Urine YELLOW (*)    APPearance HAZY (*)    Hgb urine dipstick LARGE (*)    All other components within normal limits  CHLAMYDIA/NGC RT PCR ([facility] ONLY)            HCG, QUANTITATIVE, PREGNANCY     EKG:  EKG Interpretation Date/Time:    Ventricular Rate:    PR Interval:    QRS Duration:    QT Interval:    QTC Calculation:   R Axis:      Text Interpretation:           RADIOLOGY: My personal review and interpretation of imaging: CT scan shows no kidney stones.  Appendix normal.  I have personally reviewed all radiology reports.   CT Renal Stone Study Result Date: 09/11/2023 CLINICAL DATA:  Abdominal and flank pain. Stone is suspected. Right flank pain starting Monday. Right lower quadrant pain on Sunday.  Lightheadedness. EXAM: CT ABDOMEN AND PELVIS WITHOUT CONTRAST TECHNIQUE: Multidetector CT imaging of the abdomen and pelvis was performed following the standard protocol without IV contrast. RADIATION DOSE REDUCTION: This exam was performed according to the departmental dose-optimization program which includes automated exposure control, adjustment of the mA and/or kV according to patient size and/or use of iterative reconstruction technique. COMPARISON:  None Available. FINDINGS: Lower chest: Motion artifact.  Lung bases are clear. Hepatobiliary: No focal liver abnormality is seen. No gallstones, gallbladder wall thickening, or biliary dilatation. Pancreas: Unremarkable. No pancreatic ductal dilatation or surrounding inflammatory changes. Spleen:  Normal in size without focal abnormality. Adrenals/Urinary Tract: Adrenal glands are unremarkable. Kidneys are normal, without renal calculi, focal lesion, or hydronephrosis. Bladder is unremarkable. Stomach/Bowel: Stomach, small bowel, and colon are not abnormally distended. No wall thickening or inflammatory changes. Appendix is normal. Vascular/Lymphatic: No significant vascular findings are present. No enlarged abdominal or pelvic lymph nodes. Reproductive: Uterus and bilateral adnexa are unremarkable. Other: No abdominal wall hernia or abnormality. No abdominopelvic ascites. Musculoskeletal: No acute or significant osseous findings. IMPRESSION: No acute process demonstrated in the abdomen or pelvis. No renal or ureteral stone or obstruction. Appendix is normal. Electronically Signed   By: Boyce Byes M.D.   On: 09/11/2023 01:26     PROCEDURES:  Critical Care performed: No     Procedures    IMPRESSION / MDM / ASSESSMENT AND PLAN / ED COURSE  I reviewed the triage vital signs and the nursing notes.    Patient here for right-sided abdominal pain, back pain.  The patient is on the cardiac monitor to evaluate for evidence of arrhythmia and/or  significant heart rate changes.   DIFFERENTIAL DIAGNOSIS (includes but not limited to):   Kidney stone, UTI, pyelonephritis, appendicitis, ovarian cyst, ovarian torsion, PID, musculoskeletal pain, sacroiliitis, doubt septic arthritis of the hip   Patient's presentation is most consistent with acute presentation with potential threat to life or bodily function.   PLAN: Workup initiated from triage.  No leukocytosis.  Normal creatinine, LFTs.  Lipase minimally elevated but no upper abdominal tenderness.  Pregnancy test negative.  CT scan reviewed and interpreted by myself and the radiologist.  Uterus, adnexa, appendix normal.  No kidney stones.  Pain may be more musculoskeletal.  She states she is wondering if metronidazole  could be causing her symptoms.  I feel this is unlikely but patient would like to check to see if she still has bacterial vaginosis.  Will obtain pelvic swabs.  Will give Toradol , Zofran , IV fluids for symptomatic relief.   MEDICATIONS GIVEN IN ED: Medications  lidocaine  (LIDODERM ) 5 % 1 patch (1 patch Transdermal Patch Applied 09/11/23 0442)  ketorolac  (TORADOL ) 30 MG/ML injection 30 mg (30 mg Intravenous Given 09/11/23 0314)  sodium chloride  0.9 % bolus 1,000 mL (0 mLs Intravenous Stopped 09/11/23 0504)  ondansetron  (ZOFRAN ) injection 4 mg (4 mg Intravenous Given 09/11/23 0314)  morphine  (PF) 4 MG/ML injection 4 mg (4 mg Intravenous Given 09/11/23 0441)     ED COURSE: Patient denies any significant improvement after Toradol .  Will give morphine  and reassess.  Wet prep, gonorrhea and Chlamydia swabs negative.   Patient feeling better after morphine .  I strongly suspect that this is musculoskeletal pain.  Will discharge with pain medication, anti-inflammatories.  Recommended alternating heat, ice, stretching, no heavy lifting.  I feel she is safe for discharge home.   At this time, I do not feel there is any life-threatening condition present. I reviewed all nursing notes, vitals,  pertinent previous records.  All lab and urine results, EKGs, imaging ordered have been independently reviewed and interpreted by myself.  I reviewed all available radiology reports from any imaging ordered this visit.  Based on my assessment, I feel the patient is safe to be discharged home without further emergent workup and can continue workup as an outpatient as needed. Discussed all findings, treatment plan as well as usual and customary return precautions.  They verbalize understanding and are comfortable with this plan.  Outpatient follow-up has been provided as needed.  All questions have been answered.  CONSULTS:  none   OUTSIDE RECORDS REVIEWED: Reviewed recent OB/GYN notes.       FINAL CLINICAL IMPRESSION(S) / ED DIAGNOSES   Final diagnoses:  RLQ abdominal pain  Acute right-sided low back pain without sciatica     Rx / DC Orders   ED Discharge Orders          Ordered    oxyCODONE-acetaminophen  (PERCOCET) 5-325 MG tablet  Every 8 hours PRN        09/11/23 0556    ondansetron  (ZOFRAN -ODT) 4 MG disintegrating tablet  Every 6 hours PRN        09/11/23 0556    ibuprofen  (ADVIL ) 800 MG tablet  Every 8 hours PRN        09/11/23 0556    lidocaine  (LIDODERM ) 5 %  Every 24 hours        09/11/23 0556             Note:  This document was prepared using Dragon voice recognition software and may include unintentional dictation errors.   Teja Judice, Clover Dao, DO 09/11/23 [phone]

## 2023-09-14 ENCOUNTER — Ambulatory Visit: Admitting: Internal Medicine

## 2023-09-14 ENCOUNTER — Encounter: Payer: Self-pay | Admitting: Internal Medicine

## 2023-09-14 VITALS — BP 108/72 | HR 93 | Ht <= 58 in | Wt 176.0 lb

## 2023-09-14 DIAGNOSIS — H814 Vertigo of central origin: Secondary | ICD-10-CM

## 2023-09-14 DIAGNOSIS — K219 Gastro-esophageal reflux disease without esophagitis: Secondary | ICD-10-CM | POA: Diagnosis not present

## 2023-09-14 DIAGNOSIS — Z0130 Encounter for examination of blood pressure without abnormal findings: Secondary | ICD-10-CM

## 2023-09-14 NOTE — Progress Notes (Signed)
 Established Patient Office Visit  Subjective:  Patient ID: Emily Bailey, female    DOB: January 22, 1981  Age: 43 y.o. MRN: [id number]  Chief Complaint  Patient presents with   Hospitalization Follow-up    Patient recently went to emergency room twice.  For the first time she was having right lower quadrant pain, considered to be related to Bactrim which she had to take for bacterial vaginosis.  And the second time was for positional vertigo.  Patient was started yesterday on meclizine tablets.  Today she mentions that she still feels dizzy when she moves her head and is not feeling good.  Denies nausea or vomiting.  No tingling or numbness.  However the dizziness gets pretty intense despite being on meclizine.  Patient advised rest fluids and to continue meclizine.  Will also order a CT scan of her head.    No other concerns at this time.   Past Medical History:  Diagnosis Date   Esophagitis    Gastritis    Hematuria 01/2019   on dipstick, neg microscopy with urology; reassurance.    Thalassanemia    alpha thalassemia per labs; seeing [facility] genetic counselor    Past Surgical History:  Procedure Laterality Date   CESAREAN SECTION N/A 12/08/2014   Procedure: PRIMARY CESAREAN SECTION;  Surgeon: Kris Pester, MD;  Location: [facility] ORS;  Service: Obstetrics;  Laterality: N/A;   WISDOM TOOTH EXTRACTION      Social History   Socioeconomic History   Marital status: Married    Spouse name: Not on file   Number of children: Not on file   Years of education: Not on file   Highest education level: Not on file  Occupational History   Not on file  Tobacco Use   Smoking status: Never   Smokeless tobacco: Never  Vaping Use   Vaping status: Never Used  Substance and Sexual Activity   Alcohol use: No   Drug use: No   Sexual activity: Yes    Birth control/protection: Pill  Other Topics Concern   Not on file  Social History Narrative   Not on file   Social Drivers of Health    Financial Resource Strain: Not on file  Food Insecurity: Not on file  Transportation Needs: Not on file  Physical Activity: Not on file  Stress: Not on file  Social Connections: Not on file  Intimate Partner Violence: Not on file    Family History  Problem Relation Age of Onset   Kidney failure Father    Heart Problems Father    Hypertension Father    Hyperlipidemia Father    Stroke Sister 36   Asthma Brother    Breast cancer Neg Hx    Ovarian cancer Neg Hx     No Known Allergies  Outpatient Medications Prior to Visit  Medication Sig   meclizine (ANTIVERT) 25 MG tablet Take 25 mg by mouth 3 (three) times daily as needed for dizziness.   amoxicillin -clavulanate (AUGMENTIN ) 875-125 MG tablet Take 1 tablet by mouth 2 (two) times daily. (Patient not taking: Reported on 09/14/2023)   cholecalciferol (VITAMIN D3) 25 MCG (1000 UNIT) tablet Take 2,000 Units by mouth daily. (Patient not taking: Reported on 09/14/2023)   Collagen-Vitamin C-Biotin (COLLAGEN 1500/C) 500-50-0.8 MG CAPS Take by mouth. (Patient not taking: Reported on 09/14/2023)   ibuprofen  (ADVIL ) 800 MG tablet Take 1 tablet (800 mg total) by mouth every 8 (eight) hours as needed. (Patient not taking: Reported on 09/14/2023)  levonorgestrel -ethinyl estradiol (AVIANE) 0.1-20 MG-MCG tablet Take 1 tablet by mouth daily. (Patient not taking: Reported on 09/14/2023)   lidocaine  (LIDODERM ) 5 % Place 1 patch onto the skin daily for 10 days. Remove & Discard patch within 12 hours or as directed by MD (Patient not taking: Reported on 09/14/2023)   metoprolol  succinate (TOPROL  XL) 25 MG 24 hr tablet Take 1 tablet (25 mg total) by mouth daily. (Patient not taking: Reported on 09/14/2023)   metroNIDAZOLE  (FLAGYL ) 500 MG tablet Take 1 tablet (500 mg total) by mouth 2 (two) times daily. (Patient not taking: Reported on 09/14/2023)   Multiple Vitamin (MULTIVITAMIN) tablet Take 1 tablet by mouth daily. (Patient not taking: Reported on 04/27/2023)    ondansetron  (ZOFRAN -ODT) 4 MG disintegrating tablet Take 1 tablet (4 mg total) by mouth every 6 (six) hours as needed for nausea or vomiting. (Patient not taking: Reported on 09/14/2023)   oxyCODONE -acetaminophen  (PERCOCET) 5-325 MG tablet Take 2 tablets by mouth every 8 (eight) hours as needed. (Patient not taking: Reported on 09/14/2023)   No facility-administered medications prior to visit.    Review of Systems  Constitutional:  Positive for malaise/fatigue. Negative for chills, diaphoresis, fever and weight loss.  HENT: Negative.  Negative for sore throat.   Eyes: Negative.   Respiratory: Negative.  Negative for cough and shortness of breath.   Cardiovascular: Negative.  Negative for chest pain, palpitations and leg swelling.  Gastrointestinal:  Positive for nausea. Negative for abdominal pain, constipation, diarrhea, heartburn and vomiting.  Genitourinary: Negative.  Negative for dysuria and flank pain.  Musculoskeletal: Negative.  Negative for joint pain and myalgias.  Skin: Negative.   Neurological:  Positive for dizziness. Negative for tingling, tremors, sensory change, speech change and headaches.  Endo/Heme/Allergies: Negative.   Psychiatric/Behavioral: Negative.  Negative for depression and suicidal ideas. The patient is not nervous/anxious.        Objective:   BP 108/72   Pulse 93   Ht 4\' 10"  (1.473 m)   Wt 176 lb (79.8 kg)   LMP 09/08/2023 (Exact Date)   SpO2 99%   BMI 36.78 kg/m   Vitals:   09/14/23 1134  BP: 108/72  Pulse: 93  Height: 4\' 10"  (1.473 m)  Weight: 176 lb (79.8 kg)  SpO2: 99%  BMI (Calculated): 36.79    Physical Exam Vitals and nursing note reviewed.  Constitutional:      Appearance: Normal appearance.  HENT:     Head: Normocephalic and atraumatic.     Nose: Nose normal.     Mouth/Throat:     Mouth: Mucous membranes are moist.     Pharynx: Oropharynx is clear.  Eyes:     Conjunctiva/sclera: Conjunctivae normal.     Pupils: Pupils are  equal, round, and reactive to light.  Cardiovascular:     Rate and Rhythm: Normal rate and regular rhythm.     Pulses: Normal pulses.     Heart sounds: Normal heart sounds. No murmur heard. Pulmonary:     Effort: Pulmonary effort is normal.     Breath sounds: Normal breath sounds. No wheezing.  Abdominal:     General: Bowel sounds are normal.     Palpations: Abdomen is soft.     Tenderness: There is no abdominal tenderness. There is no right CVA tenderness or left CVA tenderness.  Musculoskeletal:        General: Normal range of motion.     Cervical back: Normal range of motion.     Right lower leg:  No edema.     Left lower leg: No edema.  Skin:    General: Skin is warm and dry.  Neurological:     General: No focal deficit present.     Mental Status: She is alert and oriented to person, place, and time.  Psychiatric:        Mood and Affect: Mood normal.        Behavior: Behavior normal.      No results found for any visits on 09/14/23.  Recent Results (from the past 2160 hours)  Cervicovaginal ancillary only     Status: Abnormal   Collection Time: 08/28/23  2:58 PM  Result Value Ref Range   Neisseria Gonorrhea Negative    Chlamydia Negative    Trichomonas Negative    Bacterial Vaginitis (gardnerella) Positive (A)    Candida Vaginitis Positive (A)    Candida Glabrata Negative    Comment Normal Reference Range Candida Species - Negative    Comment Normal Reference Range Candida Galbrata - Negative    Comment Normal Reference Range Trichomonas - Negative    Comment      Normal Reference Range Bacterial Vaginosis - Negative   Comment Normal Reference Ranger Chlamydia - Negative    Comment      Normal Reference Range Neisseria Gonorrhea - Negative  Lipase, blood     Status: Abnormal   Collection Time: 09/10/23  9:55 PM  Result Value Ref Range   Lipase 52 (H) 11 - 51 U/L    Comment: Performed at [facility], [address] [place], [place] [address]   Comprehensive metabolic panel     Status: Abnormal   Collection Time: 09/10/23  9:55 PM  Result Value Ref Range   Sodium 141 135 - 145 mmol/L   Potassium 4.0 3.5 - 5.1 mmol/L   Chloride 111 98 - 111 mmol/L   CO2 22 22 - 32 mmol/L   Glucose, Bld 114 (H) 70 - 99 mg/dL    Comment: Glucose reference range applies only to samples taken after fasting for at least 8 hours.   BUN 16 6 - 20 mg/dL   Creatinine, Ser 4.03 0.44 - 1.00 mg/dL   Calcium 8.8 (L) 8.9 - 10.3 mg/dL   Total Protein 7.2 6.5 - 8.1 g/dL   Albumin 3.7 3.5 - 5.0 g/dL   AST 26 15 - 41 U/L   ALT 14 0 - 44 U/L   Alkaline Phosphatase 52 38 - 126 U/L   Total Bilirubin 0.7 0.0 - 1.2 mg/dL   GFR, Estimated >47 >42 mL/min    Comment: (NOTE) Calculated using the CKD-EPI Creatinine Equation (2021)    Anion gap 8 5 - 15    Comment: Performed at [facility], [address] [place], [place] [address]  CBC     Status: Abnormal   Collection Time: 09/10/23  9:55 PM  Result Value Ref Range   WBC 6.6 4.0 - 10.5 K/uL   RBC 6.10 (H) 3.87 - 5.11 MIL/uL   Hemoglobin 13.1 12.0 - 15.0 g/dL   HCT 87.5 64.3 - 32.9 %   MCV 66.7 (L) 80.0 - 100.0 fL   MCH 21.5 (L) 26.0 - 34.0 pg   MCHC 32.2 30.0 - 36.0 g/dL   RDW 51.8 84.1 - 66.0 %   Platelets 388 150 - 400 K/uL   nRBC 0.0 0.0 - 0.2 %    Comment: Performed at [facility], [address] [place], [place] [address]  hCG, quantitative, pregnancy     Status: None   Collection Time: 09/10/23  9:55 PM  Result Value Ref Range   hCG, Beta Chain, Quant, S <1 <5 mIU/mL    Comment:          GEST. AGE      CONC.  (mIU/mL)   <=1 WEEK        5 - 50     2 WEEKS       50 - 500     3 WEEKS       100 - 10,000     4 WEEKS     1,000 - 30,000     5 WEEKS     3,500 - 115,000   6-8 WEEKS     12,000 - 270,000    12 WEEKS     15,000 - 220,000        FEMALE AND NON-PREGNANT FEMALE:     LESS THAN 5 mIU/mL Performed at [facility], [address] [place], [place] [address]    Urinalysis, Routine w reflex microscopic -Urine, Random     Status: Abnormal   Collection Time: 09/11/23  3:15 AM  Result Value Ref Range   Color, Urine YELLOW (A) YELLOW   APPearance HAZY (A) CLEAR   Specific Gravity, Urine 1.023 1.005 - 1.030   pH 6.0 5.0 - 8.0   Glucose, UA NEGATIVE NEGATIVE mg/dL   Hgb urine dipstick LARGE (A) NEGATIVE   Bilirubin Urine NEGATIVE NEGATIVE   Ketones, ur NEGATIVE NEGATIVE mg/dL   Protein, ur NEGATIVE NEGATIVE mg/dL   Nitrite NEGATIVE NEGATIVE   Leukocytes,Ua NEGATIVE NEGATIVE   RBC / HPF 0-5 0 - 5 RBC/hpf   WBC, UA 6-10 0 - 5 WBC/hpf   Bacteria, UA NONE SEEN NONE SEEN   Squamous Epithelial / HPF 11-20 0 - 5 /HPF   Mucus PRESENT     Comment: Performed at [facility], [address] [place], [place] [address]  Wet prep, genital     Status: Abnormal   Collection Time: 09/11/23  3:15 AM   Specimen: Urine, Random  Result Value Ref Range   Yeast Wet Prep HPF POC NONE SEEN NONE SEEN   Trich, Wet Prep NONE SEEN NONE SEEN   Clue Cells Wet Prep HPF POC NONE SEEN NONE SEEN   WBC, Wet Prep HPF POC >=10 (A) <10   Sperm NONE SEEN     Comment: Specimen diluted due to transport tube containing more than 1 ml of saline, interpret results with caution. Performed at [facility], [address] [place], [place] [address]   Chlamydia/NGC rt PCR [facility] only)     Status: None   Collection Time: 09/11/23  3:15 AM   Specimen: Urine, Random  Result Value Ref Range   Specimen source GC/Chlam ENDOCERVICAL    Chlamydia Tr NOT DETECTED NOT DETECTED   N gonorrhoeae NOT DETECTED NOT DETECTED    Comment: (NOTE) This CT/NG assay has not been evaluated in patients with a history of  hysterectomy. Performed at [facility], [address] [place], [place] [address]       Assessment & Plan:  Patient advised to rest, fluids, continue meclizine for now.  Schedule CT scan of the head. Problem List Items Addressed This Visit      Gastroesophageal reflux disease without esophagitis   Relevant Medications   meclizine (ANTIVERT) 25 MG tablet   Other Visit Diagnoses  Vertigo of central origin    -  Primary   Relevant Orders   CT HEAD W & WO CONTRAST ( )       Return in about 1 week (around 09/21/2023).   Total time spent: 30 minutes  Aisha Hove, MD  09/14/2023   This document may have been prepared by [facility] Voice Recognition software and as such may include unintentional dictation errors.

## 2023-09-21 ENCOUNTER — Ambulatory Visit: Admitting: Internal Medicine

## 2023-11-19 ENCOUNTER — Ambulatory Visit: Admitting: Family

## 2023-11-19 ENCOUNTER — Encounter: Payer: Self-pay | Admitting: Family

## 2023-11-19 VITALS — BP 118/82 | HR 79 | Ht <= 58 in | Wt 179.4 lb

## 2023-11-19 DIAGNOSIS — R300 Dysuria: Secondary | ICD-10-CM

## 2023-11-19 DIAGNOSIS — N390 Urinary tract infection, site not specified: Secondary | ICD-10-CM | POA: Diagnosis not present

## 2023-11-19 DIAGNOSIS — Z0130 Encounter for examination of blood pressure without abnormal findings: Secondary | ICD-10-CM

## 2023-11-19 DIAGNOSIS — R319 Hematuria, unspecified: Secondary | ICD-10-CM

## 2023-11-19 LAB — POCT URINALYSIS DIPSTICK
Bilirubin, UA: NEGATIVE
Glucose, UA: NEGATIVE
Ketones, UA: NEGATIVE
Nitrite, UA: NEGATIVE
Protein, UA: NEGATIVE
Spec Grav, UA: 1.02 (ref 1.010–1.025)
Urobilinogen, UA: 0.2 U/dL
pH, UA: 7 (ref 5.0–8.0)

## 2023-11-19 MED ORDER — NITROFURANTOIN MONOHYD MACRO 100 MG PO CAPS: 100.0000 mg | ORAL_CAPSULE | Freq: Two times a day (BID) | ORAL | 0 refills | Status: AC

## 2023-11-19 NOTE — Progress Notes (Signed)
 Acute Office Visit  Subjective:     Patient ID: Emily Bailey, female    DOB: [birth year], 43 y.o.   MRN: [id number]  Patient is in today for  Chief Complaint  Patient presents with   Acute Visit    Pressure when urinating, blood when wiping    Urinary Tract Infection  This is a new problem. The current episode started in the past 7 days. The problem occurs every urination. The problem has been waxing and waning. The quality of the pain is described as aching (pressure). Associated symptoms include frequency, hematuria and urgency. Associated symptoms comments: Pelvic pressure.     Review of Systems  Genitourinary:  Positive for dysuria, frequency, hematuria and urgency.  All other systems reviewed and are negative.       Objective:    BP 118/82   Pulse 79   Ht 4' 10 (1.473 m)   Wt 179 lb 6.4 oz (81.4 kg)   SpO2 99%   BMI 37.49 kg/m   Physical Exam Vitals and nursing note reviewed.  Constitutional:      Appearance: Normal appearance. She is normal weight.  HENT:     Head: Normocephalic.  Eyes:     Extraocular Movements: Extraocular movements intact.     Conjunctiva/sclera: Conjunctivae normal.     Pupils: Pupils are equal, round, and reactive to light.  Cardiovascular:     Rate and Rhythm: Normal rate.  Pulmonary:     Effort: Pulmonary effort is normal.  Musculoskeletal:     Cervical back: Normal range of motion.  Neurological:     General: No focal deficit present.     Mental Status: She is alert and oriented to person, place, and time. Mental status is at baseline.  Psychiatric:        Mood and Affect: Mood normal.        Behavior: Behavior normal.        Thought Content: Thought content normal.     Results for orders placed or performed in visit on 11/19/23  POCT Urinalysis Dipstick (81002)  Result Value Ref Range   Color, UA Yellow    Clarity, UA Cloudy    Glucose, UA Negative Negative   Bilirubin, UA Negative    Ketones, UA Negative    Spec  Grav, UA 1.020 1.010 - 1.025   Blood, UA Moderate    pH, UA 7.0 5.0 - 8.0   Protein, UA Negative Negative   Urobilinogen, UA 0.2 0.2 or 1.0 E.U./dL   Nitrite, UA Negative    Leukocytes, UA Small (1+) (A) Negative   Appearance Cloudy    Odor Yes     Recent Results (from the past 2160 hours)  Cervicovaginal ancillary only     Status: Abnormal   Collection Time: 08/28/23  2:58 PM  Result Value Ref Range   Neisseria Gonorrhea Negative    Chlamydia Negative    Trichomonas Negative    Bacterial Vaginitis (gardnerella) Positive (A)    Candida Vaginitis Positive (A)    Candida Glabrata Negative    Comment Normal Reference Range Candida Species - Negative    Comment Normal Reference Range Candida Galbrata - Negative    Comment Normal Reference Range Trichomonas - Negative    Comment      Normal Reference Range Bacterial Vaginosis - Negative   Comment Normal Reference Ranger Chlamydia - Negative    Comment      Normal Reference Range Neisseria Gonorrhea - Negative  Lipase, blood  Status: Abnormal   Collection Time: 09/10/23  9:55 PM  Result Value Ref Range   Lipase 52 (H) 11 - 51 U/L    Comment: Performed at [facility], [address] [place], [place] [address]  Comprehensive metabolic panel     Status: Abnormal   Collection Time: 09/10/23  9:55 PM  Result Value Ref Range   Sodium 141 135 - 145 mmol/L   Potassium 4.0 3.5 - 5.1 mmol/L   Chloride 111 98 - 111 mmol/L   CO2 22 22 - 32 mmol/L   Glucose, Bld 114 (H) 70 - 99 mg/dL    Comment: Glucose reference range applies only to samples taken after fasting for at least 8 hours.   BUN 16 6 - 20 mg/dL   Creatinine, Ser 9.42 0.44 - 1.00 mg/dL   Calcium 8.8 (L) 8.9 - 10.3 mg/dL   Total Protein 7.2 6.5 - 8.1 g/dL   Albumin 3.7 3.5 - 5.0 g/dL   AST 26 15 - 41 U/L   ALT 14 0 - 44 U/L   Alkaline Phosphatase 52 38 - 126 U/L   Total Bilirubin 0.7 0.0 - 1.2 mg/dL   GFR, Estimated >39 >39 mL/min    Comment:  (NOTE) Calculated using the CKD-EPI Creatinine Equation (2021)    Anion gap 8 5 - 15    Comment: Performed at [facility], [address] [place], [place] [address]  CBC     Status: Abnormal   Collection Time: 09/10/23  9:55 PM  Result Value Ref Range   WBC 6.6 4.0 - 10.5 K/uL   RBC 6.10 (H) 3.87 - 5.11 MIL/uL   Hemoglobin 13.1 12.0 - 15.0 g/dL   HCT 59.2 63.9 - 53.9 %   MCV 66.7 (L) 80.0 - 100.0 fL   MCH 21.5 (L) 26.0 - 34.0 pg   MCHC 32.2 30.0 - 36.0 g/dL   RDW 84.7 88.4 - 84.4 %   Platelets 388 150 - 400 K/uL   nRBC 0.0 0.0 - 0.2 %    Comment: Performed at [facility], [address] [place], [place] [address]  hCG, quantitative, pregnancy     Status: None   Collection Time: 09/10/23  9:55 PM  Result Value Ref Range   hCG, Beta Chain, Quant, S <1 <5 mIU/mL    Comment:          GEST. AGE      CONC.  (mIU/mL)   <=1 WEEK        5 - 50     2 WEEKS       50 - 500     3 WEEKS       100 - 10,000     4 WEEKS     1,000 - 30,000     5 WEEKS     3,500 - 115,000   6-8 WEEKS     12,000 - 270,000    12 WEEKS     15,000 - 220,000        FEMALE AND NON-PREGNANT FEMALE:     LESS THAN 5 mIU/mL Performed at [facility], [address] [place], [place] [address]   Urinalysis, Routine w reflex microscopic -Urine, Random     Status: Abnormal   Collection Time: 09/11/23  3:15 AM  Result Value Ref Range   Color, Urine YELLOW (A) YELLOW   APPearance HAZY (A) CLEAR   Specific Gravity, Urine 1.023 1.005 - 1.030   pH 6.0  5.0 - 8.0   Glucose, UA NEGATIVE NEGATIVE mg/dL   Hgb urine dipstick LARGE (A) NEGATIVE   Bilirubin Urine NEGATIVE NEGATIVE   Ketones, ur NEGATIVE NEGATIVE mg/dL   Protein, ur NEGATIVE NEGATIVE mg/dL   Nitrite NEGATIVE NEGATIVE   Leukocytes,Ua NEGATIVE NEGATIVE   RBC / HPF 0-5 0 - 5 RBC/hpf   WBC, UA 6-10 0 - 5 WBC/hpf   Bacteria, UA NONE SEEN NONE SEEN   Squamous Epithelial / HPF 11-20 0 - 5 /HPF   Mucus PRESENT     Comment: Performed  at [facility], [address] [place], [place] [address]  Wet prep, genital     Status: Abnormal   Collection Time: 09/11/23  3:15 AM   Specimen: Urine, Random  Result Value Ref Range   Yeast Wet Prep HPF POC NONE SEEN NONE SEEN   Trich, Wet Prep NONE SEEN NONE SEEN   Clue Cells Wet Prep HPF POC NONE SEEN NONE SEEN   WBC, Wet Prep HPF POC >=10 (A) <10   Sperm NONE SEEN     Comment: Specimen diluted due to transport tube containing more than 1 ml of saline, interpret results with caution. Performed at [facility], [address] [place], [place] [address]   Chlamydia/NGC rt PCR [facility] only)     Status: None   Collection Time: 09/11/23  3:15 AM   Specimen: Urine, Random  Result Value Ref Range   Specimen source GC/Chlam ENDOCERVICAL    Chlamydia Tr NOT DETECTED NOT DETECTED   N gonorrhoeae NOT DETECTED NOT DETECTED    Comment: (NOTE) This CT/NG assay has not been evaluated in patients with a history of  hysterectomy. Performed at [facility], [address] [place], [place] [address]   POCT Urinalysis Dipstick [phone])     Status: Abnormal   Collection Time: 11/19/23  9:35 AM  Result Value Ref Range   Color, UA Yellow    Clarity, UA Cloudy    Glucose, UA Negative Negative   Bilirubin, UA Negative    Ketones, UA Negative    Spec Grav, UA 1.020 1.010 - 1.025   Blood, UA Moderate    pH, UA 7.0 5.0 - 8.0   Protein, UA Negative Negative   Urobilinogen, UA 0.2 0.2 or 1.0 E.U./dL   Nitrite, UA Negative    Leukocytes, UA Small (1+) (A) Negative   Appearance Cloudy    Odor Yes     Allergies as of 11/19/2023   No Known Allergies      Medication List        Accurate as of November 19, 2023  9:41 AM. If you have any questions, ask your nurse or doctor.          amoxicillin-clavulanate 875-125 MG tablet Commonly known as: AUGMENTIN Take 1 tablet by mouth 2 (two) times daily.   cholecalciferol 25 MCG (1000 UNIT) tablet Commonly known  as: VITAMIN D3 Take 2,000 Units by mouth daily.   Collagen 1500/C 500-50-0.8 MG Caps Generic drug: Collagen-Vitamin C-Biotin Take by mouth.   ibuprofen 800 MG tablet Commonly known as: ADVIL Take 1 tablet (800 mg total) by mouth every 8 (eight) hours as needed.   levonorgestrel-ethinyl estradiol 0.1-20 MG-MCG tablet Commonly known as: Aviane Take 1 tablet by mouth daily.   meclizine 25 MG tablet Commonly known as: ANTIVERT Take 25 mg by mouth 3 (three) times daily as needed for dizziness.   metoprolol succinate 25 MG 24 hr tablet Commonly known  as: Toprol XL Take 1 tablet (25 mg total) by mouth daily.   metroNIDAZOLE 500 MG tablet Commonly known as: FLAGYL Take 1 tablet (500 mg total) by mouth 2 (two) times daily.   multivitamin tablet Take 1 tablet by mouth daily.   ondansetron 4 MG disintegrating tablet Commonly known as: ZOFRAN-ODT Take 1 tablet (4 mg total) by mouth every 6 (six) hours as needed for nausea or vomiting.   oxyCODONE-acetaminophen 5-325 MG tablet Commonly known as: Percocet Take 2 tablets by mouth every 8 (eight) hours as needed.            Assessment & Plan Dysuria UA in office abnormal.  Sending UA/UC to [facility]. Antibiotics sent to patient.  She is aware we will call with results when available.     Return as scheduled unless not improving.  Total time spent: 20 minutes  ALAN CHRISTELLA ARRANT, FNP  11/19/2023   This document may have been prepared by [facility] Voice Recognition software and as such may include unintentional dictation errors.

## 2023-11-20 ENCOUNTER — Ambulatory Visit: Payer: Self-pay | Admitting: Internal Medicine

## 2023-11-20 LAB — URINALYSIS, ROUTINE W REFLEX MICROSCOPIC
Bilirubin, UA: NEGATIVE
Glucose, UA: NEGATIVE
Ketones, UA: NEGATIVE
Nitrite, UA: NEGATIVE
Protein,UA: NEGATIVE
Specific Gravity, UA: 1.019 (ref 1.005–1.030)
Urobilinogen, Ur: 0.2 mg/dL (ref 0.2–1.0)
pH, UA: 7 (ref 5.0–7.5)

## 2023-11-20 LAB — MICROSCOPIC EXAMINATION
Bacteria, UA: NONE SEEN
Casts: NONE SEEN /LPF
WBC, UA: >30 /HPF — AB (ref 0–5)

## 2023-11-21 LAB — URINE CULTURE

## 2024-02-12 IMAGING — MG MM DIGITAL SCREENING BILAT W/ TOMO AND CAD
8 series · 8 of 24 positions shown · non-contrast
Comparison: Previous exam(s).

CLINICAL DATA: Screening.

EXAM:
DIGITAL SCREENING BILATERAL MAMMOGRAM WITH TOMOSYNTHESIS AND CAD
TECHNIQUE: Bilateral screening digital craniocaudal and mediolateral oblique
mammograms were obtained. Bilateral screening digital breast
tomosynthesis was performed. The images were evaluated with
computer-aided detection.

[L MLO synth-2D]
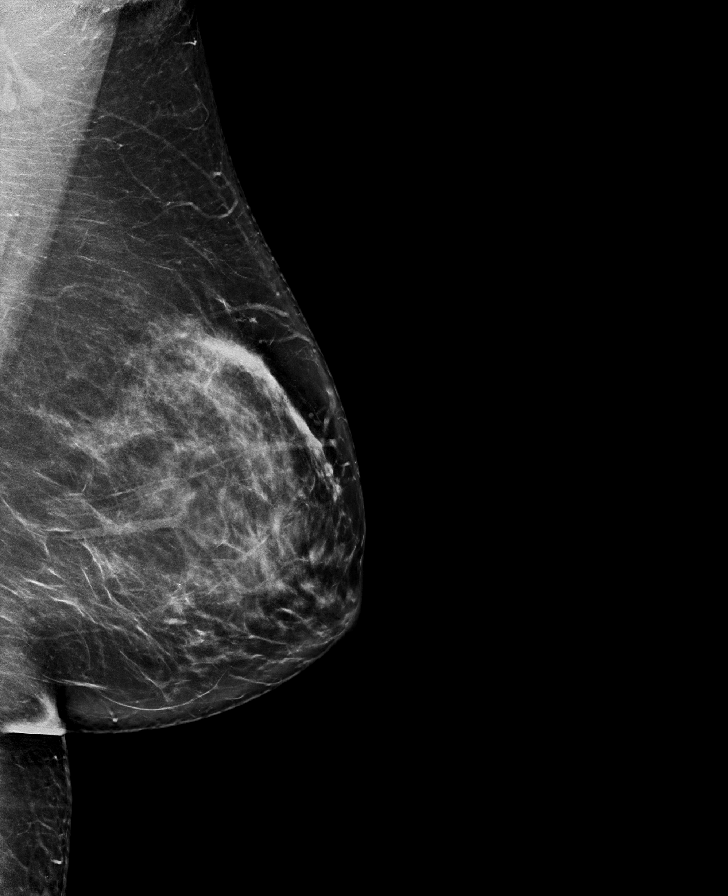

[R MLO synth-2D]
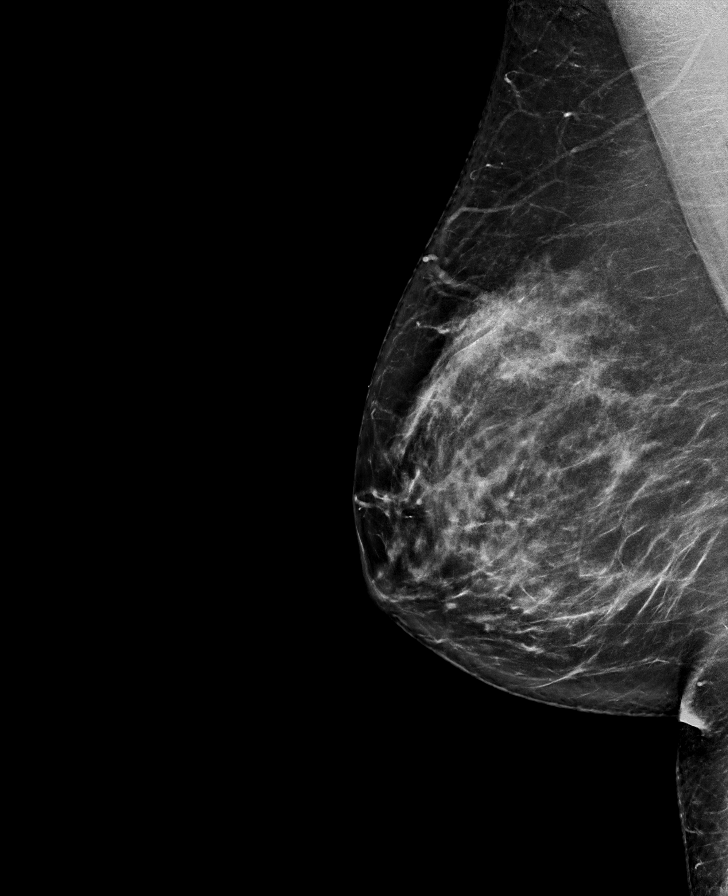

[R CC synth-2D]
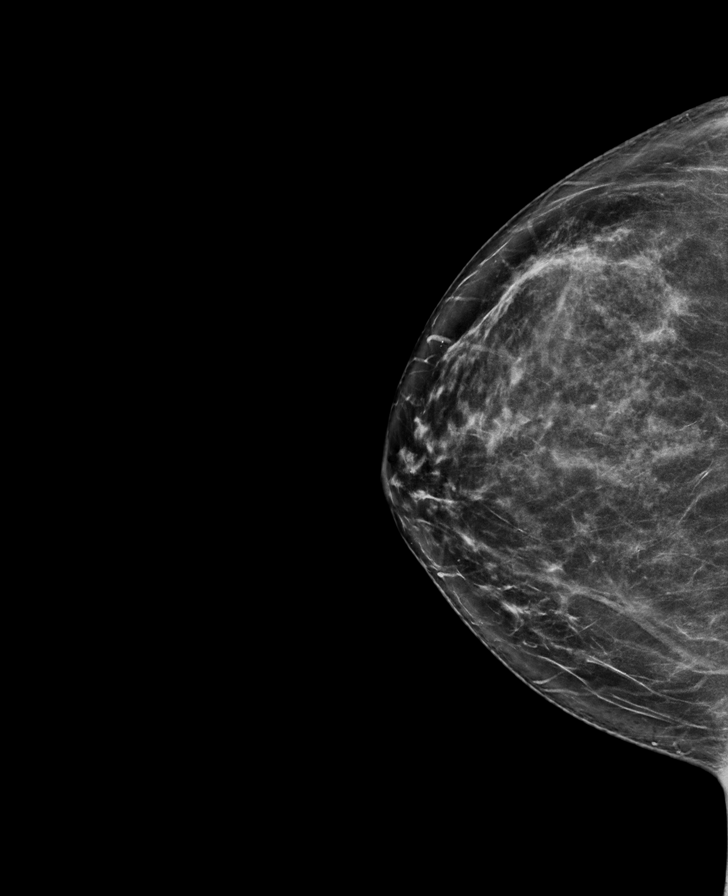

[L CC synth-2D]
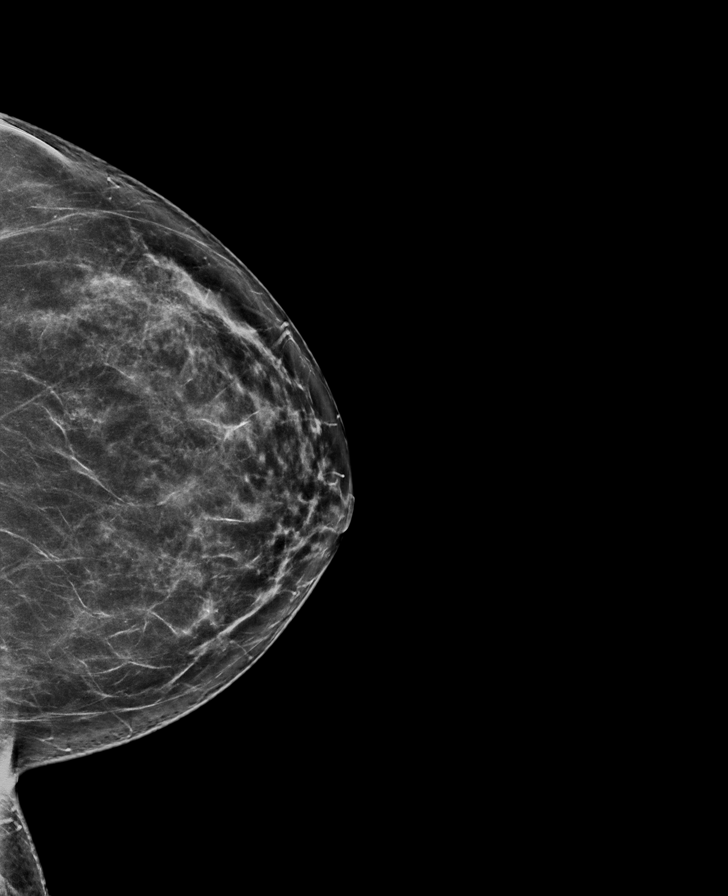

[R CC tomo · tomo slice 39/78.0]
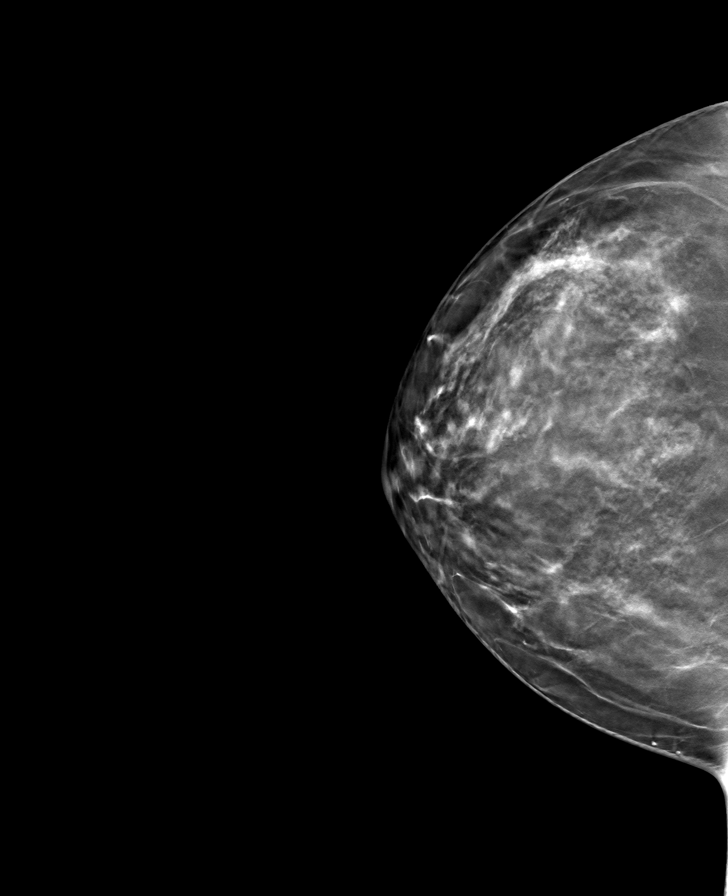

[L CC tomo · tomo slice 39/78.0]
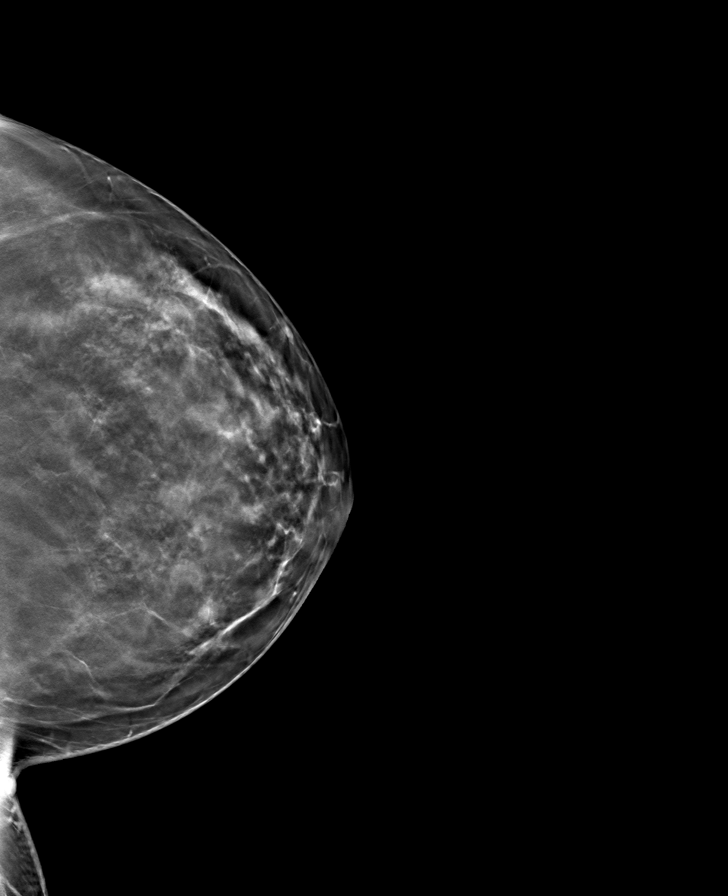

[L MLO tomo · tomo slice 40/79.0]
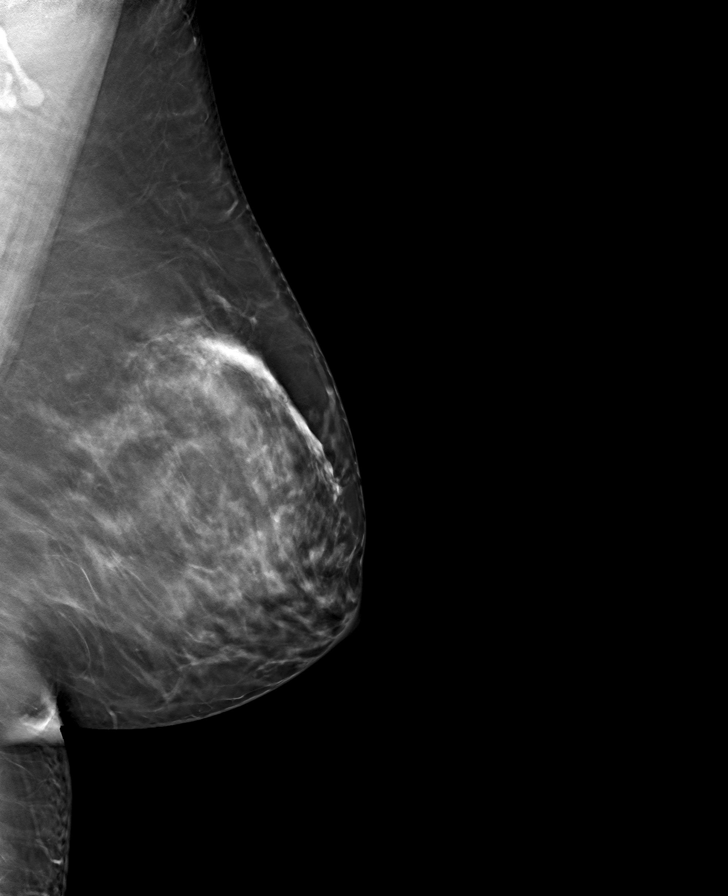

[R MLO tomo · tomo slice 41/82.0]
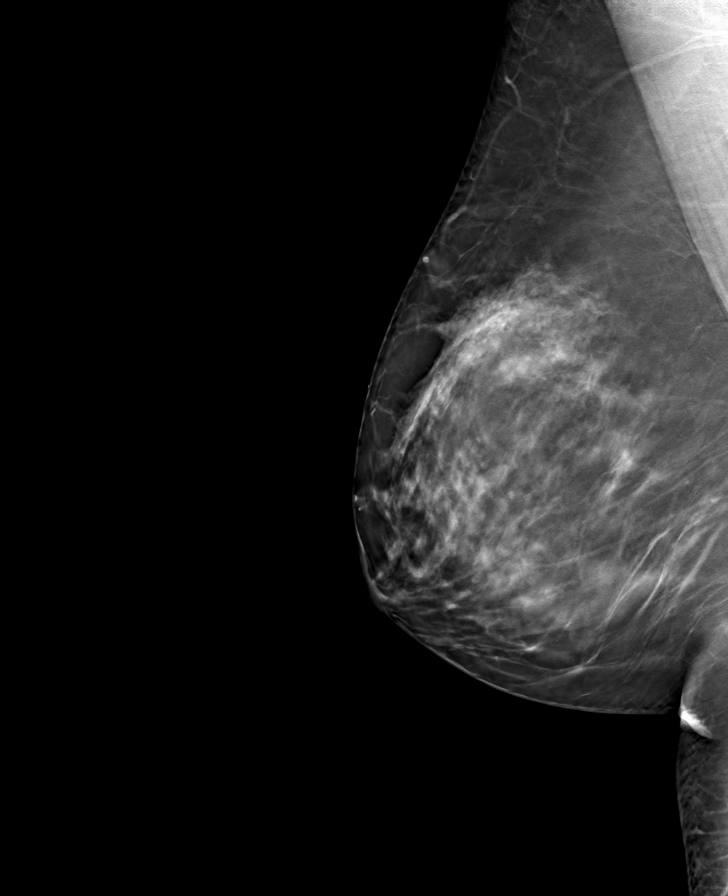

[8 of 24 positions shown; findings below may reference images not displayed]

ACR Breast Density Category c: The breast tissue is heterogeneously
dense, which may obscure small masses.
FINDINGS: There are no findings suspicious for malignancy.
IMPRESSION: No mammographic evidence of malignancy. A result letter of this
screening mammogram will be mailed directly to the patient.

RECOMMENDATION:
Screening mammogram in one year. (Code:Q3-W-BC3)

BI-RADS CATEGORY  1: Negative.

## 2024-02-28 ENCOUNTER — Other Ambulatory Visit: Payer: Self-pay | Admitting: Obstetrics and Gynecology

## 2024-02-28 DIAGNOSIS — Z30011 Encounter for initial prescription of contraceptive pills: Secondary | ICD-10-CM
